# Patient Record
Sex: Female | Born: 1965
Health system: Southern US, Community
[De-identification: ages and names within clinical notes are randomized; demographics above are authoritative.]

## PROBLEM LIST (undated history)

## (undated) DIAGNOSIS — M1990 Unspecified osteoarthritis, unspecified site: Secondary | ICD-10-CM

## (undated) DIAGNOSIS — E559 Vitamin D deficiency, unspecified: Secondary | ICD-10-CM

## (undated) DIAGNOSIS — D649 Anemia, unspecified: Secondary | ICD-10-CM

## (undated) DIAGNOSIS — E119 Type 2 diabetes mellitus without complications: Secondary | ICD-10-CM

## (undated) DIAGNOSIS — I10 Essential (primary) hypertension: Secondary | ICD-10-CM

## (undated) DIAGNOSIS — F32A Depression, unspecified: Secondary | ICD-10-CM

## (undated) DIAGNOSIS — E785 Hyperlipidemia, unspecified: Secondary | ICD-10-CM

## (undated) HISTORY — DX: Hyperlipidemia, unspecified: E78.5

## (undated) HISTORY — DX: Depression, unspecified: F32.A

## (undated) HISTORY — DX: Type 2 diabetes mellitus without complications: E11.9

## (undated) HISTORY — DX: Vitamin D deficiency, unspecified: E55.9

---

## 1990-06-18 HISTORY — PX: DILATION AND CURETTAGE OF UTERUS: SHX78

## 1999-07-18 ENCOUNTER — Other Ambulatory Visit: Admission: RE | Admit: 1999-07-18 | Discharge: 1999-07-18 | Payer: Self-pay | Admitting: Obstetrics and Gynecology

## 2000-05-20 ENCOUNTER — Encounter: Payer: Self-pay | Admitting: Obstetrics and Gynecology

## 2000-05-20 ENCOUNTER — Ambulatory Visit (HOSPITAL_COMMUNITY): Admission: RE | Admit: 2000-05-20 | Discharge: 2000-05-20 | Payer: Self-pay | Admitting: Obstetrics and Gynecology

## 2000-07-12 ENCOUNTER — Other Ambulatory Visit: Admission: RE | Admit: 2000-07-12 | Discharge: 2000-07-12 | Payer: Self-pay | Admitting: Obstetrics and Gynecology

## 2001-07-29 ENCOUNTER — Other Ambulatory Visit: Admission: RE | Admit: 2001-07-29 | Discharge: 2001-07-29 | Payer: Self-pay

## 2001-08-11 ENCOUNTER — Encounter: Admission: RE | Admit: 2001-08-11 | Discharge: 2001-08-11 | Payer: Self-pay

## 2001-10-13 ENCOUNTER — Ambulatory Visit (HOSPITAL_COMMUNITY): Admission: RE | Admit: 2001-10-13 | Discharge: 2001-10-13 | Payer: Self-pay

## 2002-04-20 ENCOUNTER — Encounter: Admission: RE | Admit: 2002-04-20 | Discharge: 2002-04-20 | Payer: Self-pay

## 2002-07-03 ENCOUNTER — Ambulatory Visit (HOSPITAL_COMMUNITY): Admission: RE | Admit: 2002-07-03 | Discharge: 2002-07-03 | Payer: Self-pay

## 2002-07-03 ENCOUNTER — Encounter (INDEPENDENT_AMBULATORY_CARE_PROVIDER_SITE_OTHER): Payer: Self-pay | Admitting: *Deleted

## 2003-03-30 ENCOUNTER — Other Ambulatory Visit: Admission: RE | Admit: 2003-03-30 | Discharge: 2003-03-30 | Payer: Self-pay | Admitting: Obstetrics and Gynecology

## 2005-01-05 ENCOUNTER — Other Ambulatory Visit: Admission: RE | Admit: 2005-01-05 | Discharge: 2005-01-05 | Payer: Self-pay | Admitting: Obstetrics and Gynecology

## 2005-01-31 ENCOUNTER — Encounter: Admission: RE | Admit: 2005-01-31 | Discharge: 2005-01-31 | Payer: Self-pay | Admitting: Obstetrics and Gynecology

## 2005-12-26 ENCOUNTER — Other Ambulatory Visit: Admission: RE | Admit: 2005-12-26 | Discharge: 2005-12-26 | Payer: Self-pay | Admitting: Nurse Practitioner

## 2006-02-01 ENCOUNTER — Encounter: Admission: RE | Admit: 2006-02-01 | Discharge: 2006-02-01 | Payer: Self-pay | Admitting: Obstetrics and Gynecology

## 2007-02-03 ENCOUNTER — Encounter: Admission: RE | Admit: 2007-02-03 | Discharge: 2007-02-03 | Payer: Self-pay | Admitting: Obstetrics and Gynecology

## 2007-07-10 ENCOUNTER — Other Ambulatory Visit: Admission: RE | Admit: 2007-07-10 | Discharge: 2007-07-10 | Payer: Self-pay | Admitting: Obstetrics and Gynecology

## 2007-07-18 ENCOUNTER — Ambulatory Visit: Payer: Self-pay | Admitting: Oncology

## 2007-07-31 LAB — CBC WITH DIFFERENTIAL/PLATELET
BASO%: 0.1 % (ref 0.0–2.0)
Basophils Absolute: 0 10*3/uL (ref 0.0–0.1)
EOS%: 1.2 % (ref 0.0–7.0)
HCT: 33.5 % — ABNORMAL LOW (ref 34.8–46.6)
HGB: 11 g/dL — ABNORMAL LOW (ref 11.6–15.9)
MCH: 27.1 pg (ref 26.0–34.0)
MCHC: 32.9 g/dL (ref 32.0–36.0)
MONO#: 0.3 10*3/uL (ref 0.1–0.9)
NEUT%: 66.2 % (ref 39.6–76.8)
RDW: 16.7 % — ABNORMAL HIGH (ref 11.3–14.5)
WBC: 7.6 10*3/uL (ref 3.9–10.0)
lymph#: 2.2 10*3/uL (ref 0.9–3.3)

## 2007-07-31 LAB — COMPREHENSIVE METABOLIC PANEL
ALT: 11 U/L (ref 0–35)
Alkaline Phosphatase: 92 U/L (ref 39–117)
CO2: 23 mEq/L (ref 19–32)
Creatinine, Ser: 1 mg/dL (ref 0.40–1.20)
Sodium: 139 mEq/L (ref 135–145)
Total Bilirubin: 0.5 mg/dL (ref 0.3–1.2)

## 2007-07-31 LAB — IRON AND TIBC
%SAT: 13 % — ABNORMAL LOW (ref 20–55)
Iron: 39 ug/dL — ABNORMAL LOW (ref 42–145)
UIBC: 262 ug/dL

## 2007-07-31 LAB — CHCC SMEAR

## 2007-07-31 LAB — FERRITIN: Ferritin: 28 ng/mL (ref 10–291)

## 2007-11-05 ENCOUNTER — Ambulatory Visit: Payer: Self-pay | Admitting: Oncology

## 2008-02-04 ENCOUNTER — Encounter: Admission: RE | Admit: 2008-02-04 | Discharge: 2008-02-04 | Payer: Self-pay | Admitting: Obstetrics and Gynecology

## 2008-02-09 ENCOUNTER — Ambulatory Visit: Payer: Self-pay | Admitting: Oncology

## 2008-02-10 LAB — CBC WITH DIFFERENTIAL/PLATELET
Basophils Absolute: 0 10*3/uL (ref 0.0–0.1)
Eosinophils Absolute: 0.2 10*3/uL (ref 0.0–0.5)
HCT: 33 % — ABNORMAL LOW (ref 34.8–46.6)
HGB: 11 g/dL — ABNORMAL LOW (ref 11.6–15.9)
LYMPH%: 25.2 % (ref 14.0–48.0)
MCV: 83.7 fL (ref 81.0–101.0)
MONO#: 0.5 10*3/uL (ref 0.1–0.9)
MONO%: 5.4 % (ref 0.0–13.0)
NEUT#: 5.9 10*3/uL (ref 1.5–6.5)
NEUT%: 66.8 % (ref 39.6–76.8)
Platelets: 351 10*3/uL (ref 145–400)
WBC: 8.8 10*3/uL (ref 3.9–10.0)

## 2008-02-10 LAB — FERRITIN: Ferritin: 34 ng/mL (ref 10–291)

## 2008-02-10 LAB — IRON AND TIBC: %SAT: 7 % — ABNORMAL LOW (ref 20–55)

## 2008-05-07 ENCOUNTER — Ambulatory Visit: Payer: Self-pay | Admitting: Oncology

## 2008-05-11 LAB — COMPREHENSIVE METABOLIC PANEL
Albumin: 4 g/dL (ref 3.5–5.2)
BUN: 10 mg/dL (ref 6–23)
CO2: 25 mEq/L (ref 19–32)
Calcium: 9.5 mg/dL (ref 8.4–10.5)
Chloride: 102 mEq/L (ref 96–112)
Creatinine, Ser: 1.03 mg/dL (ref 0.40–1.20)

## 2008-05-11 LAB — CBC WITH DIFFERENTIAL/PLATELET
Basophils Absolute: 0 10*3/uL (ref 0.0–0.1)
Eosinophils Absolute: 0.1 10*3/uL (ref 0.0–0.5)
HCT: 35.1 % (ref 34.8–46.6)
HGB: 11.7 g/dL (ref 11.6–15.9)
MONO#: 0.4 10*3/uL (ref 0.1–0.9)
NEUT#: 7.1 10*3/uL — ABNORMAL HIGH (ref 1.5–6.5)
NEUT%: 70.6 % (ref 39.6–76.8)
WBC: 10 10*3/uL (ref 3.9–10.0)
lymph#: 2.4 10*3/uL (ref 0.9–3.3)

## 2008-05-11 LAB — FERRITIN: Ferritin: 50 ng/mL (ref 10–291)

## 2008-05-11 LAB — IRON AND TIBC: UIBC: 236 ug/dL

## 2008-08-11 ENCOUNTER — Other Ambulatory Visit: Admission: RE | Admit: 2008-08-11 | Discharge: 2008-08-11 | Payer: Self-pay | Admitting: Obstetrics and Gynecology

## 2008-11-08 ENCOUNTER — Ambulatory Visit: Payer: Self-pay | Admitting: Oncology

## 2008-11-10 LAB — CBC WITH DIFFERENTIAL/PLATELET
Basophils Absolute: 0 10*3/uL (ref 0.0–0.1)
Eosinophils Absolute: 0.2 10*3/uL (ref 0.0–0.5)
LYMPH%: 25.7 % (ref 14.0–49.7)
MCV: 84 fL (ref 79.5–101.0)
MONO%: 4.4 % (ref 0.0–14.0)
NEUT#: 6.1 10*3/uL (ref 1.5–6.5)
Platelets: 334 10*3/uL (ref 145–400)
RBC: 4.12 10*6/uL (ref 3.70–5.45)

## 2008-11-10 LAB — IRON AND TIBC: Iron: 44 ug/dL (ref 42–145)

## 2008-11-10 LAB — FERRITIN: Ferritin: 113 ng/mL (ref 10–291)

## 2009-02-07 ENCOUNTER — Encounter: Admission: RE | Admit: 2009-02-07 | Discharge: 2009-02-07 | Payer: Self-pay | Admitting: Obstetrics and Gynecology

## 2009-02-18 ENCOUNTER — Encounter: Admission: RE | Admit: 2009-02-18 | Discharge: 2009-02-18 | Payer: Self-pay | Admitting: Sports Medicine

## 2009-03-04 ENCOUNTER — Encounter: Admission: RE | Admit: 2009-03-04 | Discharge: 2009-03-04 | Payer: Self-pay | Admitting: Sports Medicine

## 2009-04-08 ENCOUNTER — Ambulatory Visit: Payer: Self-pay | Admitting: Oncology

## 2009-04-12 LAB — IRON AND TIBC
TIBC: 288 ug/dL (ref 250–470)
UIBC: 251 ug/dL

## 2009-04-12 LAB — CBC WITH DIFFERENTIAL/PLATELET
Basophils Absolute: 0 10*3/uL (ref 0.0–0.1)
Eosinophils Absolute: 0.1 10*3/uL (ref 0.0–0.5)
HGB: 10.9 g/dL — ABNORMAL LOW (ref 11.6–15.9)
MONO#: 0.6 10*3/uL (ref 0.1–0.9)
NEUT#: 5.2 10*3/uL (ref 1.5–6.5)
RDW: 15.2 % — ABNORMAL HIGH (ref 11.2–14.5)
lymph#: 2.7 10*3/uL (ref 0.9–3.3)

## 2009-04-12 LAB — COMPREHENSIVE METABOLIC PANEL
AST: 14 U/L (ref 0–37)
Albumin: 3.6 g/dL (ref 3.5–5.2)
BUN: 13 mg/dL (ref 6–23)
CO2: 23 mEq/L (ref 19–32)
Calcium: 8.9 mg/dL (ref 8.4–10.5)
Chloride: 105 mEq/L (ref 96–112)
Glucose, Bld: 101 mg/dL — ABNORMAL HIGH (ref 70–99)
Potassium: 4.1 mEq/L (ref 3.5–5.3)

## 2009-08-09 ENCOUNTER — Ambulatory Visit: Payer: Self-pay | Admitting: Oncology

## 2009-08-11 LAB — CBC WITH DIFFERENTIAL/PLATELET
Basophils Absolute: 0 10*3/uL (ref 0.0–0.1)
Eosinophils Absolute: 0.2 10*3/uL (ref 0.0–0.5)
HGB: 11.3 g/dL — ABNORMAL LOW (ref 11.6–15.9)
MONO#: 0.3 10*3/uL (ref 0.1–0.9)
MONO%: 3.9 % (ref 0.0–14.0)
NEUT#: 5.4 10*3/uL (ref 1.5–6.5)
RBC: 4.08 10*6/uL (ref 3.70–5.45)
RDW: 14.5 % (ref 11.2–14.5)
WBC: 8.5 10*3/uL (ref 3.9–10.3)
lymph#: 2.6 10*3/uL (ref 0.9–3.3)

## 2009-08-11 LAB — IRON AND TIBC
%SAT: 14 % — ABNORMAL LOW (ref 20–55)
Iron: 36 ug/dL — ABNORMAL LOW (ref 42–145)
TIBC: 258 ug/dL (ref 250–470)
UIBC: 222 ug/dL

## 2009-08-11 LAB — FERRITIN: Ferritin: 72 ng/mL (ref 10–291)

## 2009-12-05 ENCOUNTER — Ambulatory Visit: Payer: Self-pay | Admitting: Oncology

## 2009-12-07 LAB — CBC WITH DIFFERENTIAL/PLATELET
Basophils Absolute: 0 10*3/uL (ref 0.0–0.1)
HCT: 31.2 % — ABNORMAL LOW (ref 34.8–46.6)
HGB: 10.5 g/dL — ABNORMAL LOW (ref 11.6–15.9)
LYMPH%: 24 % (ref 14.0–49.7)
MCHC: 33.8 g/dL (ref 31.5–36.0)
MONO#: 0.5 10*3/uL (ref 0.1–0.9)
NEUT%: 69.5 % (ref 38.4–76.8)
Platelets: 329 10*3/uL (ref 145–400)
WBC: 9.9 10*3/uL (ref 3.9–10.3)
lymph#: 2.4 10*3/uL (ref 0.9–3.3)

## 2009-12-07 LAB — IRON AND TIBC
%SAT: 13 % — ABNORMAL LOW (ref 20–55)
TIBC: 298 ug/dL (ref 250–470)
UIBC: 260 ug/dL

## 2010-02-21 ENCOUNTER — Encounter: Admission: RE | Admit: 2010-02-21 | Discharge: 2010-02-21 | Payer: Self-pay | Admitting: Obstetrics and Gynecology

## 2010-02-23 ENCOUNTER — Other Ambulatory Visit: Admission: RE | Admit: 2010-02-23 | Discharge: 2010-02-23 | Payer: Self-pay | Admitting: Obstetrics and Gynecology

## 2010-03-07 ENCOUNTER — Encounter: Admission: RE | Admit: 2010-03-07 | Discharge: 2010-03-07 | Payer: Self-pay | Admitting: Obstetrics and Gynecology

## 2010-04-05 ENCOUNTER — Encounter: Admission: RE | Admit: 2010-04-05 | Discharge: 2010-04-05 | Payer: Self-pay | Admitting: Sports Medicine

## 2010-07-08 ENCOUNTER — Encounter: Payer: Self-pay | Admitting: Sports Medicine

## 2010-09-07 ENCOUNTER — Encounter (HOSPITAL_BASED_OUTPATIENT_CLINIC_OR_DEPARTMENT_OTHER): Payer: 59 | Admitting: Oncology

## 2010-09-07 ENCOUNTER — Other Ambulatory Visit: Payer: Self-pay | Admitting: Oncology

## 2010-09-07 DIAGNOSIS — D509 Iron deficiency anemia, unspecified: Secondary | ICD-10-CM

## 2010-09-07 LAB — CBC WITH DIFFERENTIAL/PLATELET
BASO%: 0.4 % (ref 0.0–2.0)
EOS%: 2 % (ref 0.0–7.0)
HCT: 33.4 % — ABNORMAL LOW (ref 34.8–46.6)
LYMPH%: 32.7 % (ref 14.0–49.7)
MCH: 27.7 pg (ref 25.1–34.0)
MCHC: 33.4 g/dL (ref 31.5–36.0)
NEUT%: 60.4 % (ref 38.4–76.8)
RBC: 4.03 10*6/uL (ref 3.70–5.45)
lymph#: 2.2 10*3/uL (ref 0.9–3.3)

## 2010-09-07 LAB — COMPREHENSIVE METABOLIC PANEL
ALT: 11 U/L (ref 0–35)
AST: 18 U/L (ref 0–37)
Chloride: 102 mEq/L (ref 96–112)
Creatinine, Ser: 1 mg/dL (ref 0.40–1.20)
Sodium: 136 mEq/L (ref 135–145)
Total Bilirubin: 0.3 mg/dL (ref 0.3–1.2)
Total Protein: 6.9 g/dL (ref 6.0–8.3)

## 2010-09-07 LAB — IRON AND TIBC: %SAT: 8 % — ABNORMAL LOW (ref 20–55)

## 2010-09-07 LAB — FERRITIN: Ferritin: 79 ng/mL (ref 10–291)

## 2010-11-03 NOTE — Op Note (Signed)
   NAME:  Denise Rogers, Denise Rogers                    ACCOUNT NO.:  [id number]   MEDICAL RECORD NO.:  [id number]                   PATIENT TYPE:  AMB   LOCATION:  SDC                                  FACILITY:  [facility]   PHYSICIAN:  Ronda Fairly. Galen Daft, M.D.              DATE OF BIRTH:  1966/05/14   DATE OF PROCEDURE:  07/03/2002  DATE OF DISCHARGE:  07/03/2002                                 OPERATIVE REPORT   PREOPERATIVE DIAGNOSES:  1. Fibroids.  2. Abnormal uterine bleeding.   POSTOPERATIVE DIAGNOSES:  1. Fibroids.  2. Abnormal uterine bleeding.   PROCEDURE:  Dilatation and curettage with diagnostic hysteroscopy.   SURGEON:  Ronda Fairly. Galen Daft, M.D.   ESTIMATED BLOOD LOSS:  Minimal.   COMPLICATIONS:  None.   ANESTHESIA:  LMA.   PROCEDURE NOTE:  The patient was identified as Development worker, international aid.  Time out  had been performed.  The patient had Betadine prep and sterile technique.  After anesthesia was complete the bladder was catheterized and the cervix  was dilated to accept the hysteroscope.  Hysteroscope was placed in as a  large posterior uterine fibroid.  Otherwise, unremarkable examination.  The  patient had tolerated procedure quite well; however, this fibroid was not  resectable with hysteroscopic instrumentation.  It would require  hysterectomy or open laparotomy if treatment was indicated.  The patient  left the operating room in stable condition after this instrument was  removed and curettage was performed.  There were no complications from the  procedure.  Total estimated blood loss was less than 10 mL and all  instrument, sponge, and needle counts were correct at the end of the case.                                               Ronda Fairly. Galen Daft, M.D.    NJT/MEDQ  D:  07/14/2002  T:  07/14/2002  Job:  [id number]

## 2011-03-01 ENCOUNTER — Other Ambulatory Visit: Payer: Self-pay | Admitting: Family Medicine

## 2011-03-01 DIAGNOSIS — Z1231 Encounter for screening mammogram for malignant neoplasm of breast: Secondary | ICD-10-CM

## 2011-03-27 ENCOUNTER — Ambulatory Visit
Admission: RE | Admit: 2011-03-27 | Discharge: 2011-03-27 | Disposition: A | Discharge status: Home / Self Care | Payer: 59 | Source: Ambulatory Visit | Attending: Family Medicine | Admitting: Family Medicine

## 2011-03-27 DIAGNOSIS — Z1231 Encounter for screening mammogram for malignant neoplasm of breast: Secondary | ICD-10-CM

## 2011-04-27 ENCOUNTER — Encounter (HOSPITAL_COMMUNITY): Payer: Self-pay | Admitting: Pharmacy Technician

## 2011-04-27 NOTE — Patient Instructions (Addendum)
   Your procedure is scheduled on: Monday, Nov 19th  Enter through the Main Entrance of [facility] at: 11:30am Pick up the phone at the desk and dial [phone] and inform us of your arrival.  Please call this number if you have any problems the morning of surgery: [phone]  Remember: Do not eat food after midnight: Sunday Do not drink clear liquids after: Sunday per anesthesia Take these medicines the morning of surgery with a SIP OF WATER:your blood pressure medicine  Do not wear jewelry, make-up, or FINGER nail polish Do not wear lotions, powders, or perfumes.  You may not  wear deodorant. Do not shave 48 hours prior to surgery. Do not bring valuables to the hospital.  Leave suitcase in the car. After Surgery it may be brought to your room. For patients being admitted to the hospital, checkout time is 11:00am the day of discharge.  Patients discharged on the day of surgery will not be allowed to drive home.     Remember to use your hibiclens as instructed.Please shower with 1/2 bottle the evening before your surgery and the other 1/2 bottle the morning of surgery.

## 2011-04-30 ENCOUNTER — Other Ambulatory Visit: Payer: Self-pay | Admitting: Obstetrics & Gynecology

## 2011-05-01 ENCOUNTER — Encounter (HOSPITAL_COMMUNITY)
Admission: RE | Admit: 2011-05-01 | Discharge: 2011-05-01 | Disposition: A | Discharge status: Home / Self Care | Payer: 59 | Source: Ambulatory Visit | Attending: Obstetrics & Gynecology | Admitting: Obstetrics & Gynecology

## 2011-05-01 ENCOUNTER — Encounter (HOSPITAL_COMMUNITY): Payer: Self-pay

## 2011-05-01 ENCOUNTER — Other Ambulatory Visit: Payer: Self-pay

## 2011-05-01 HISTORY — DX: Essential (primary) hypertension: I10

## 2011-05-01 HISTORY — DX: Anemia, unspecified: D64.9

## 2011-05-01 HISTORY — DX: Unspecified osteoarthritis, unspecified site: M19.90

## 2011-05-01 LAB — CBC
HCT: 34.7 % — ABNORMAL LOW (ref 36.0–46.0)
Hemoglobin: 11.2 g/dL — ABNORMAL LOW (ref 12.0–15.0)
MCH: 27.1 pg (ref 26.0–34.0)
RBC: 4.13 MIL/uL (ref 3.87–5.11)

## 2011-05-01 LAB — BASIC METABOLIC PANEL
BUN: 10 mg/dL (ref 6–23)
CO2: 29 mEq/L (ref 19–32)
Calcium: 9.8 mg/dL (ref 8.4–10.5)
Glucose, Bld: 108 mg/dL — ABNORMAL HIGH (ref 70–99)
Potassium: 3.8 mEq/L (ref 3.5–5.1)
Sodium: 137 mEq/L (ref 135–145)

## 2011-05-01 LAB — SURGICAL PCR SCREEN: Staphylococcus aureus: NEGATIVE

## 2011-05-07 ENCOUNTER — Ambulatory Visit (HOSPITAL_COMMUNITY): Payer: 59 | Admitting: Anesthesiology

## 2011-05-07 ENCOUNTER — Encounter (HOSPITAL_COMMUNITY): Payer: Self-pay | Admitting: Anesthesiology

## 2011-05-07 ENCOUNTER — Other Ambulatory Visit: Payer: Self-pay | Admitting: Obstetrics & Gynecology

## 2011-05-07 ENCOUNTER — Encounter (HOSPITAL_COMMUNITY): Payer: Self-pay | Admitting: *Deleted

## 2011-05-07 ENCOUNTER — Encounter (HOSPITAL_COMMUNITY)
Admission: RE | Disposition: A | Discharge status: Home / Self Care | Payer: Self-pay | Source: Ambulatory Visit | Attending: Obstetrics & Gynecology

## 2011-05-07 ENCOUNTER — Ambulatory Visit (HOSPITAL_COMMUNITY)
Admission: RE | Admit: 2011-05-07 | Discharge: 2011-05-08 | Disposition: A | Discharge status: Home / Self Care | Payer: 59 | Source: Ambulatory Visit | Attending: Obstetrics & Gynecology | Admitting: Obstetrics & Gynecology

## 2011-05-07 DIAGNOSIS — Z01812 Encounter for preprocedural laboratory examination: Secondary | ICD-10-CM | POA: Insufficient documentation

## 2011-05-07 DIAGNOSIS — N949 Unspecified condition associated with female genital organs and menstrual cycle: Secondary | ICD-10-CM | POA: Insufficient documentation

## 2011-05-07 DIAGNOSIS — N920 Excessive and frequent menstruation with regular cycle: Secondary | ICD-10-CM | POA: Insufficient documentation

## 2011-05-07 DIAGNOSIS — D259 Leiomyoma of uterus, unspecified: Secondary | ICD-10-CM | POA: Insufficient documentation

## 2011-05-07 DIAGNOSIS — Z01818 Encounter for other preprocedural examination: Secondary | ICD-10-CM | POA: Insufficient documentation

## 2011-05-07 HISTORY — PX: ABDOMINAL HYSTERECTOMY: SHX81

## 2011-05-07 HISTORY — PX: ROBOTIC ASSISTED TOTAL HYSTERECTOMY: SHX6085

## 2011-05-07 LAB — PREGNANCY, URINE: Preg Test, Ur: NEGATIVE

## 2011-05-07 LAB — ABO/RH: ABO/RH(D): A POS

## 2011-05-07 LAB — TYPE AND SCREEN: Antibody Screen: NEGATIVE

## 2011-05-07 SURGERY — ROBOTIC ASSISTED TOTAL HYSTERECTOMY
Anesthesia: General | Wound class: Clean Contaminated

## 2011-05-07 MED ORDER — HYDROMORPHONE HCL PF 1 MG/ML IJ SOLN: 0.2000 mg | INTRAMUSCULAR | Status: DC | PRN

## 2011-05-07 MED ORDER — KETOROLAC TROMETHAMINE 30 MG/ML IJ SOLN
15.0000 mg | Freq: Once | INTRAMUSCULAR | Status: AC | PRN
  Administered 2011-05-07: 30 mg via INTRAVENOUS

## 2011-05-07 MED ORDER — CEFAZOLIN SODIUM 1-5 GM-% IV SOLN
INTRAVENOUS | Status: AC
  Filled 2011-05-07: qty 50

## 2011-05-07 MED ORDER — PROPOFOL 10 MG/ML IV EMUL
INTRAVENOUS | Status: AC
Start: 2011-05-07 — End: 2011-05-07
  Filled 2011-05-07: qty 40

## 2011-05-07 MED ORDER — IBUPROFEN 800 MG PO TABS: 800.0000 mg | ORAL_TABLET | Freq: Three times a day (TID) | ORAL | Status: DC | PRN

## 2011-05-07 MED ORDER — ROCURONIUM BROMIDE 50 MG/5ML IV SOLN
INTRAVENOUS | Status: AC
  Filled 2011-05-07: qty 1

## 2011-05-07 MED ORDER — NEOSTIGMINE METHYLSULFATE 1 MG/ML IJ SOLN
INTRAMUSCULAR | Status: AC
  Filled 2011-05-07: qty 10

## 2011-05-07 MED ORDER — LIDOCAINE HCL (CARDIAC) 20 MG/ML IV SOLN
INTRAVENOUS | Status: AC
  Filled 2011-05-07: qty 5

## 2011-05-07 MED ORDER — FENTANYL CITRATE 0.05 MG/ML IJ SOLN
INTRAMUSCULAR | Status: AC
  Filled 2011-05-07: qty 2

## 2011-05-07 MED ORDER — ONDANSETRON HCL 4 MG/2ML IJ SOLN
INTRAMUSCULAR | Status: DC | PRN
  Administered 2011-05-07: 4 mg via INTRAVENOUS

## 2011-05-07 MED ORDER — PROPOFOL 10 MG/ML IV EMUL
INTRAVENOUS | Status: DC | PRN
  Administered 2011-05-07: 150 mg via INTRAVENOUS
  Administered 2011-05-07: 20 mg via INTRAVENOUS
  Administered 2011-05-07: 50 mg via INTRAVENOUS
  Administered 2011-05-07 (×2): 20 mg via INTRAVENOUS

## 2011-05-07 MED ORDER — MIDAZOLAM HCL 2 MG/2ML IJ SOLN
INTRAMUSCULAR | Status: AC
Start: 2011-05-07 — End: 2011-05-07
  Filled 2011-05-07: qty 2

## 2011-05-07 MED ORDER — LIDOCAINE HCL (CARDIAC) 20 MG/ML IV SOLN
INTRAVENOUS | Status: DC | PRN
  Administered 2011-05-07: 60 mg via INTRAVENOUS

## 2011-05-07 MED ORDER — LISINOPRIL-HYDROCHLOROTHIAZIDE 10-12.5 MG PO TABS: 1.0000 | ORAL_TABLET | Freq: Every day | ORAL | Status: DC

## 2011-05-07 MED ORDER — FENTANYL CITRATE 0.05 MG/ML IJ SOLN
25.0000 ug | INTRAMUSCULAR | Status: DC | PRN
  Administered 2011-05-07 (×2): 25 ug via INTRAVENOUS
  Administered 2011-05-07: 50 ug via INTRAVENOUS

## 2011-05-07 MED ORDER — CEFAZOLIN SODIUM 1-5 GM-% IV SOLN
1.0000 g | INTRAVENOUS | Status: AC
  Administered 2011-05-07: 1 g via INTRAVENOUS

## 2011-05-07 MED ORDER — ROCURONIUM BROMIDE 50 MG/5ML IV SOLN
INTRAVENOUS | Status: AC
Start: 2011-05-07 — End: 2011-05-07
  Filled 2011-05-07: qty 1

## 2011-05-07 MED ORDER — FENTANYL CITRATE 0.05 MG/ML IJ SOLN
INTRAMUSCULAR | Status: DC | PRN
  Administered 2011-05-07: 50 ug via INTRAVENOUS
  Administered 2011-05-07 (×3): 25 ug via INTRAVENOUS

## 2011-05-07 MED ORDER — ONDANSETRON HCL 4 MG/2ML IJ SOLN
INTRAMUSCULAR | Status: AC
  Filled 2011-05-07: qty 2

## 2011-05-07 MED ORDER — ROCURONIUM BROMIDE 100 MG/10ML IV SOLN
INTRAVENOUS | Status: DC | PRN
  Administered 2011-05-07 (×2): 10 mg via INTRAVENOUS
  Administered 2011-05-07: 5 mg via INTRAVENOUS
  Administered 2011-05-07 (×2): 10 mg via INTRAVENOUS
  Administered 2011-05-07: 50 mg via INTRAVENOUS
  Administered 2011-05-07: 5 mg via INTRAVENOUS

## 2011-05-07 MED ORDER — DEXAMETHASONE SODIUM PHOSPHATE 10 MG/ML IJ SOLN
INTRAMUSCULAR | Status: DC | PRN
  Administered 2011-05-07: 10 mg via INTRAVENOUS

## 2011-05-07 MED ORDER — LISINOPRIL 10 MG PO TABS
10.0000 mg | ORAL_TABLET | Freq: Every day | ORAL | Status: DC
  Administered 2011-05-08: 10 mg via ORAL
  Filled 2011-05-07 (×2): qty 1

## 2011-05-07 MED ORDER — KETOROLAC TROMETHAMINE 30 MG/ML IJ SOLN
INTRAMUSCULAR | Status: AC
  Filled 2011-05-07: qty 1

## 2011-05-07 MED ORDER — SUFENTANIL CITRATE 50 MCG/ML IV SOLN
INTRAVENOUS | Status: AC
  Filled 2011-05-07: qty 1

## 2011-05-07 MED ORDER — OXYCODONE-ACETAMINOPHEN 5-325 MG PO TABS
1.0000 | ORAL_TABLET | ORAL | Status: DC | PRN
  Administered 2011-05-08 (×2): 1 via ORAL
  Filled 2011-05-07 (×2): qty 1

## 2011-05-07 MED ORDER — LACTATED RINGERS IV SOLN
INTRAVENOUS | Status: DC
  Administered 2011-05-07: 23:00:00 via INTRAVENOUS

## 2011-05-07 MED ORDER — KETOROLAC TROMETHAMINE 30 MG/ML IJ SOLN
30.0000 mg | Freq: Four times a day (QID) | INTRAMUSCULAR | Status: DC
  Administered 2011-05-08 (×2): 30 mg via INTRAVENOUS
  Filled 2011-05-07 (×2): qty 1

## 2011-05-07 MED ORDER — LACTATED RINGERS IV SOLN
INTRAVENOUS | Status: DC
  Administered 2011-05-07 (×3): via INTRAVENOUS

## 2011-05-07 MED ORDER — HYDROCHLOROTHIAZIDE 12.5 MG PO CAPS
12.5000 mg | ORAL_CAPSULE | Freq: Every day | ORAL | Status: DC
  Administered 2011-05-08: 12.5 mg via ORAL
  Filled 2011-05-07 (×2): qty 1

## 2011-05-07 MED ORDER — BUPIVACAINE HCL (PF) 0.25 % IJ SOLN
INTRAMUSCULAR | Status: DC | PRN
  Administered 2011-05-07: 15 mL

## 2011-05-07 MED ORDER — PROPOFOL 10 MG/ML IV EMUL
INTRAVENOUS | Status: AC
  Filled 2011-05-07: qty 20

## 2011-05-07 MED ORDER — MIDAZOLAM HCL 5 MG/5ML IJ SOLN
INTRAMUSCULAR | Status: DC | PRN
  Administered 2011-05-07: 2 mg via INTRAVENOUS

## 2011-05-07 MED ORDER — GLYCOPYRROLATE 0.2 MG/ML IJ SOLN
INTRAMUSCULAR | Status: AC
  Filled 2011-05-07: qty 2

## 2011-05-07 MED ORDER — SUFENTANIL CITRATE 50 MCG/ML IV SOLN
INTRAVENOUS | Status: DC | PRN
  Administered 2011-05-07: 10 ug via INTRAVENOUS
  Administered 2011-05-07 (×2): 5 ug via INTRAVENOUS
  Administered 2011-05-07: 10 ug via INTRAVENOUS
  Administered 2011-05-07: 5 ug via INTRAVENOUS
  Administered 2011-05-07: 10 ug via INTRAVENOUS
  Administered 2011-05-07: 15 ug via INTRAVENOUS

## 2011-05-07 MED ORDER — DEXAMETHASONE SODIUM PHOSPHATE 10 MG/ML IJ SOLN
INTRAMUSCULAR | Status: AC
  Filled 2011-05-07: qty 1

## 2011-05-07 SURGICAL SUPPLY — 61 items
BAG URINE DRAINAGE (UROLOGICAL SUPPLIES) ×2 IMPLANT
BARRIER ADHS 3X4 INTERCEED (GAUZE/BANDAGES/DRESSINGS) ×2 IMPLANT
BLADE LAPAROSCOPIC MORCELL KIT (BLADE) ×4 IMPLANT
BLADELESS LONG 8MM (BLADE) IMPLANT
CABLE HIGH FREQUENCY MONO STRZ (ELECTRODE) ×2 IMPLANT
CATH FOLEY 3WAY  5CC 16FR (CATHETERS) ×1
CATH FOLEY 3WAY 5CC 16FR (CATHETERS) ×1 IMPLANT
CLOTH BEACON ORANGE TIMEOUT ST (SAFETY) ×2 IMPLANT
CONT PATH 16OZ SNAP LID 3702 (MISCELLANEOUS) ×2 IMPLANT
COVER MAYO STAND STRL (DRAPES) ×2 IMPLANT
COVER TABLE BACK 60X90 (DRAPES) ×4 IMPLANT
COVER TIP SHEARS 8 DVNC (MISCELLANEOUS) ×1 IMPLANT
COVER TIP SHEARS 8MM DA VINCI (MISCELLANEOUS) ×1
DECANTER SPIKE VIAL GLASS SM (MISCELLANEOUS) ×2 IMPLANT
DERMABOND ADVANCED (GAUZE/BANDAGES/DRESSINGS) ×2
DERMABOND ADVANCED .7 DNX12 (GAUZE/BANDAGES/DRESSINGS) ×2 IMPLANT
DRAPE HUG U DISPOSABLE (DRAPE) ×2 IMPLANT
DRAPE LG THREE QUARTER DISP (DRAPES) ×4 IMPLANT
DRAPE MONITOR DA VINCI (DRAPE) ×2 IMPLANT
DRAPE WARM FLUID 44X44 (DRAPE) ×2 IMPLANT
ELECT REM PT RETURN 9FT ADLT (ELECTROSURGICAL) ×2
ELECTRODE REM PT RTRN 9FT ADLT (ELECTROSURGICAL) ×1 IMPLANT
EVACUATOR SMOKE 8.L (FILTER) ×2 IMPLANT
GAUZE VASELINE 3X9 (GAUZE/BANDAGES/DRESSINGS) ×2 IMPLANT
GLOVE BIO SURGEON STRL SZ 6.5 (GLOVE) ×4 IMPLANT
GOWN PREVENTION PLUS LG XLONG (DISPOSABLE) ×8 IMPLANT
IV STOPCOCK 4 WAY 40  W/Y SET (IV SOLUTION)
IV STOPCOCK 4 WAY 40 W/Y SET (IV SOLUTION) IMPLANT
KIT DISP ACCESSORY 4 ARM (KITS) ×2 IMPLANT
NEEDLE HYPO 22GX1.5 SAFETY (NEEDLE) IMPLANT
NEEDLE INSUFFLATION 14GA 120MM (NEEDLE) IMPLANT
OCCLUDER COLPOPNEUMO (BALLOONS) ×2 IMPLANT
PACK LAVH (CUSTOM PROCEDURE TRAY) ×2 IMPLANT
PAD PREP 24X48 CUFFED NSTRL (MISCELLANEOUS) ×4 IMPLANT
PLUG CATH AND CAP STER (CATHETERS) ×2 IMPLANT
POSITIONER SURGICAL ARM (MISCELLANEOUS) IMPLANT
SET IRRIG TUBING LAPAROSCOPIC (IRRIGATION / IRRIGATOR) ×2 IMPLANT
SOLUTION ELECTROLUBE (MISCELLANEOUS) ×2 IMPLANT
SPONGE LAP 18X18 X RAY DECT (DISPOSABLE) IMPLANT
SUT VIC AB 0 CT1 27 (SUTURE) ×2
SUT VIC AB 0 CT1 27XBRD ANBCTR (SUTURE) ×2 IMPLANT
SUT VIC AB 0 CT1 27XBRD ANTBC (SUTURE) IMPLANT
SUT VIC AB 4-0 PS2 27 (SUTURE) IMPLANT
SUT VICRYL 0 27 CT2 27 ABS (SUTURE) IMPLANT
SUT VICRYL 0 UR6 27IN ABS (SUTURE) ×4 IMPLANT
SUT VICRYL RAPIDE 4/0 PS 2 (SUTURE) ×2 IMPLANT
SYR 50ML LL SCALE MARK (SYRINGE) ×2 IMPLANT
SYSTEM CONVERTIBLE TROCAR (TROCAR) ×2 IMPLANT
TIP UTERINE 5.1X6CM LAV DISP (MISCELLANEOUS) IMPLANT
TIP UTERINE 6.7X10CM GRN DISP (MISCELLANEOUS) ×2 IMPLANT
TIP UTERINE 6.7X6CM WHT DISP (MISCELLANEOUS) IMPLANT
TIP UTERINE 6.7X8CM BLUE DISP (MISCELLANEOUS) IMPLANT
TOWEL OR 17X24 6PK STRL BLUE (TOWEL DISPOSABLE) ×6 IMPLANT
TROCAR 12M 150ML BLUNT (TROCAR) IMPLANT
TROCAR DISP BLADELESS 8 DVNC (TROCAR) ×1 IMPLANT
TROCAR DISP BLADELESS 8MM (TROCAR) ×1
TROCAR XCEL 12X100 BLDLESS (ENDOMECHANICALS) ×2 IMPLANT
TROCAR XCEL NON-BLD 5MMX100MML (ENDOMECHANICALS) ×2 IMPLANT
TUBING FILTER THERMOFLATOR (ELECTROSURGICAL) ×2 IMPLANT
WARMER LAPAROSCOPE (MISCELLANEOUS) ×2 IMPLANT
WATER STERILE IRR 1000ML POUR (IV SOLUTION) ×6 IMPLANT

## 2011-05-07 NOTE — Op Note (Signed)
05/07/2011  7:40 PM  PATIENT:  Denise Rogers  45 y.o. female  PRE-OPERATIVE DIAGNOSIS:  Large symptomatic myomas  POST-OPERATIVE DIAGNOSIS:  Large symptomatic myomas, very calcified  PROCEDURE:  Procedure(s): ROBOTIC ASSISTED TOTAL HYSTERECTOMY and MORCELLATION   SURGEON:  Surgeon(s): Marie-Lyne Steva Ready A. Fogleman  ASSISTANTS: Alphonsus Sias. Fogleman  ANESTHESIA:   general  Procedure:  Under general anesthesia, the patient is in lithotomy position. She is prepped with surgi- prep on the abdomen and Betadine on the suprapubic and vulvar areas.  She is draped as usual. Vaginally the Foley catheter is put in place in the bladder. The vaginal exam reveals a very large nodular uterus which is about 16 cm in diameter, it is mobile.  The weighted speculum is inserted in the vagina the tenaculum is applied on the anterior lip of the cervix the hysterometry is at 10 cm. We dilated the cervix with Hegar dilators to number 21 and used a #10 roomy with a small Ko-ring.  The tenaculum and weighted speculum are removed.  At the abdomen we measured 10 cm above the fundus of the uterus, a 1.5 cm on supraumbilical incision was made with a scalpel after infiltration with Marcaine one quarter plain.  The aponeurosis was opened under direct vision with the Mayo scissors and the perirectal peritoneum was opened bluntly with a finger. A pursestring stitch of Vicryl was called put on the aponeurosis. We then inserted the [facility] and created a pneumoperitoneum with CO2. The camera was entered at that level. The uterus was large and presented multiple myomas.  No adhesion was present with the anterior wall of the abdomen. We used a semicircular configuration for port placement. Infiltration with Marcaine one quarter plain at all sites.  An incision was made with a scalpel for 8 mm robotic ports and a 5 mm assistant port.  2 robotic ports were placed on the right side and the assistant port was on the upper left  side with the other robotic port on the lower left.  All ports were put in place under direct vision.  We then docked the robot from the right side. The instruments were inserted under direct vision.  The Endo Shears scissors in the right hand, the PK in the left hand and the pro-grasp in the 3rd arm.  We then went to the console. The console time was about 2 hours and 15 minutes.  We started on the right side.  The right ovary and tube were normal in size and appearance.  We cauterized and sectioned the right tube, the right utero-ovarian ligament and the right round ligament.  We then opened the visceral peritoneum anteriorly and posteriorly and stopped before the uterine artery.  We proceeded exactly the same way on the left side. The third arm was used at all times to help with retraction.  We then switched to a 30 down camera to be able to visualize the anterior part of the uterus better in order to descend the bladder flap.  The visceral peritoneum was completely opened anteriorly and the bladder was descended passed the core ring. On either side of the cervix were large myomas, exactly at the level where we usually cauterize the uterine arteries.  We therefore cauterized the uterine arteries at a higher level in order to minimize blood loss.  We then lifted the fibroids with the tenaculum in the third arm and stayed very close to the the uterus to complete the cauterization of the uterine arteries on  both sides.  We then opened the vaginal vault on the Ko-ring starting anteriorly, then on the sides and finishing posteriorly.  The Ko-ring and roomy are were removed to complete the cauterization and section of the vaginal vault posteriorly.  We then verified hemostasis and it was adequate at all sites. We switched instruments to the cutting needle driver in the right hand, the regular needle driver on the left hand and the PK in the third arm.  We switched back to the straight camera.  We used a V-Lock suture  to close the vaginal vault.  We started at the right angle, taking large bites in a running suture, including the vaginal mucosa.  We doubled the closure by coming back on our steps after reaching the left angle.  The occluder was removed from the vagina.  Hemostasis was adequate at all levels. We therefore removed all instruments. We undocked the robot at 16:03.  We then went to laparoscopy time.  The V-lock suture was removed through the camera port using a 5 mm camera in the assistant port.  Repositioned the patient in less Trendelenburg and raised her legs. The Stortz morcellator was inserted at the supraumbilical incision.  Morcellation went uneventfully but took more time than expected due to the large calcified myoma. We had to use a second Stortz morcellator because the blade was becoming dull.  The weight of the uterus with the myomas was above 1300 g.  We then irrigated and suctioned the abdominopelvic cavities. Hemostasis was verified and was adequate at all levels. Pictures were taken before and after the hysterectomy and  The procedure was recorded on DVD.  All instruments were removed.  The ports were removed under direct vision.  The CO2 was evacuated.  The supraumbilical incision was of close by attaching the pursestring stitch. We then closed the skin on all incisions with a subcuticular stitch of Vicryl 4-0. Dermabond was applied on all incisions. The patient was brought to recovery room in good stable status.  ESTIMATED BLOOD LOSS: 100 cc   Intake/Output Summary (Last 24 hours) at 05/07/11 1940 Last data filed at 05/07/11 1700  Gross per 24 hour  Intake   2000 ml  Output    550 ml  Net   1450 ml     BLOOD ADMINISTERED:none   LOCAL MEDICATIONS USED:  MARCAINE 20 CC  SPECIMEN:  Source of Specimen:  Myomatous uterus with cervix  DISPOSITION OF SPECIMEN:  PATHOLOGY  COUNTS:  YES   PLAN OF CARE: Transfer to PACU    Genia Del MD  19:40 on 05/07/11

## 2011-05-07 NOTE — H&P (Signed)
Denise Rogers is an 45 y.o. female [id number]  RP:  Large symptomatic myomas for TLH da Vinci, morcellation  Pertinent Gynecological History: Menses: Heavy  Blood transfusions: none Sexually transmitted diseases: none Previous GYN Procedures:  none  Last mammogram: normal Last pap: normal  Menstrual History:  No LMP recorded.    Past Medical History  Diagnosis Date  . Hypertension   . Anemia   . Arthritis     shoulder    Past Surgical History  Procedure Date  . Dilation and curettage of uterus 1992    History reviewed. No pertinent family history.  Social History:  reports that she has never smoked. She does not have any smokeless tobacco history on file. She reports that she does not drink alcohol or use illicit drugs.  Allergies: No Known Allergies  Prescriptions prior to admission  Medication Sig Dispense Refill  . lisinopril-hydrochlorothiazide (PRINZIDE,ZESTORETIC) 10-12.5 MG per tablet Take 1 tablet by mouth daily.        Marland Kitchen aspirin EC 81 MG tablet Take 81 mg by mouth daily.        . B Complex-C-Min-Fe-FA (HEMOCYTE-PLUS PO) Take 1 tablet by mouth daily.        . Calcium Carbonate-Vitamin D (CALCIUM + D) 600-200 MG-UNIT TABS Take 1 tablet by mouth daily.        . Multiple Vitamins-Minerals (MULTIVITAMIN WITH MINERALS) tablet Take 1 tablet by mouth daily.        Marland Kitchen omega-3 acid ethyl esters (LOVAZA) 1 G capsule Take 1 g by mouth daily.        . Potassium 99 MG TABS Take 1 tablet by mouth daily.          Blood pressure 128/71, pulse 88, temperature 98.2 F (36.8 C), temperature source Oral, resp. rate 18, SpO2 100.00%. US uterus about 1600 cc  with multiple myomas.  Results for orders placed during the hospital encounter of 05/07/11 (from the past 24 hour(s))  PREGNANCY, URINE     Status: Normal   Collection Time   05/07/11 11:26 AM      Component Value Range   Preg Test, Ur NEGATIVE    TYPE AND SCREEN     Status: Normal   Collection Time   05/07/11  11:27 AM      Component Value Range   ABO/RH(D) A POS     Antibody Screen NEG     Sample Expiration 05/10/2011    ABO/RH     Status: Normal   Collection Time   05/07/11 11:50 AM      Component Value Range   ABO/RH(D) A POS      No results found.  Assessment/Plan: Large symptomatic myomas with pelvic pain and menorrhagia for [facility] da Vinci, morcellation.  Surgery and risks reviewed.  Evelena Masci,MARIE-LYNE 05/07/2011, 1:07 PM

## 2011-05-07 NOTE — Transfer of Care (Signed)
Immediate Anesthesia Transfer of Care Note  Patient: Denise Rogers  Procedure(s) Performed:  ROBOTIC ASSISTED TOTAL HYSTERECTOMY - DaVinci TLH with Storz morcellator. Requests 3 hrs.  Patient Location: PACU  Anesthesia Type: General  Level of Consciousness: awake and sedated  Airway & Oxygen Therapy: Patient Spontanous Breathing and Patient connected to nasal cannula oxygen  Post-op Assessment: Report given to PACU RN and Post -op Vital signs reviewed and stable  Post vital signs: Reviewed and stable  Complications: No apparent anesthesia complications

## 2011-05-07 NOTE — Anesthesia Preprocedure Evaluation (Signed)
Anesthesia Evaluation  Patient identified by MRN, date of birth, ID band Patient awake    Reviewed: Allergy & Precautions, H&P , NPO status , Patient's Chart, lab work & pertinent test results, reviewed documented beta blocker date and time   History of Anesthesia Complications Negative for: history of anesthetic complications  Airway Mallampati: II      Dental  (+) Teeth Intact   Pulmonary neg pulmonary ROS,  clear to auscultation  Pulmonary exam normal       Cardiovascular Exercise Tolerance: Good hypertension, On Medications regular Normal+ Systolic murmurs (ii/vi)    Neuro/Psych Back pain - improved after pain injections Negative Neurological ROS  Negative Psych ROS   GI/Hepatic negative GI ROS, Neg liver ROS,   Endo/Other  Morbid obesity  Renal/GU negative Renal ROS  Genitourinary negative   Musculoskeletal   Abdominal   Peds  Hematology negative hematology ROS (+) anemia ,   Anesthesia Other Findings   Reproductive/Obstetrics negative OB ROS                           Anesthesia Physical Anesthesia Plan  ASA: II  Anesthesia Plan: General   Post-op Pain Management:    Induction:   Airway Management Planned: Oral ETT  Additional Equipment:   Intra-op Plan:   Post-operative Plan:   Informed Consent: I have reviewed the patients History and Physical, chart, labs and discussed the procedure including the risks, benefits and alternatives for the proposed anesthesia with the patient or authorized representative who has indicated his/her understanding and acceptance.   Dental Advisory Given  Plan Discussed with: CRNA and Surgeon  Anesthesia Plan Comments:         Anesthesia Quick Evaluation

## 2011-05-07 NOTE — Anesthesia Postprocedure Evaluation (Signed)
Anesthesia Post Note  Patient: Denise Rogers  Procedure(s) Performed:  ROBOTIC ASSISTED TOTAL HYSTERECTOMY - DaVinci TLH with Storz morcellator. Requests 3 hrs.  Anesthesia type: General  Patient location: PACU  Post pain: Pain level controlled  Post assessment: Post-op Vital signs reviewed  Last Vitals:  Filed Vitals:   05/07/11 2115  BP: 121/64  Pulse: 90  Temp:   Resp: 18    Post vital signs: Reviewed  Level of consciousness: sedated  Complications: No apparent anesthesia complicationsfj

## 2011-05-08 LAB — CBC
MCH: 27.2 pg (ref 26.0–34.0)
MCHC: 33.1 g/dL (ref 30.0–36.0)
RDW: 14.8 % (ref 11.5–15.5)

## 2011-05-08 MED ORDER — OXYCODONE-ACETAMINOPHEN 7.5-325 MG PO TABS: 1.0000 | ORAL_TABLET | ORAL | Status: AC | PRN

## 2011-05-08 NOTE — Progress Notes (Signed)
Pt discharged to home with family.  Condition stable.  Pt to car via wheelchair.  No equipment ordered for home at discharged.

## 2011-05-08 NOTE — Progress Notes (Signed)
1 Day Post-Op Procedure(s) (LRB): ROBOTIC ASSISTED TOTAL HYSTERECTOMY (N/A)  Subjective: Patient reports that pain is well managed.  Tolerating normal diet as tolerated  diet without difficulty. No nausea / vomiting.  Ambulating and voiding.  Objective: BP 102/65  Pulse 92  Temp(Src) 99.3 F (37.4 C) (Oral)  Resp 20  Ht 5\' 6"  (1.676 m)  Wt 102.059 kg (225 lb)  BMI 36.32 kg/m2  SpO2 97% Lungs: clear Heart: normal rate and rhythm Abdomen:soft and appropriately tender Extremities: Homans sign is negative, no sign of DVT Incision: healing well  Assessment: s/p Procedure(s): ROBOTIC ASSISTED TOTAL HYSTERECTOMY: stable  Plan: Discharge home  Post op recommendations discussed.  LOS: 1 day    Denise Rogers,Denise Rogers 05/08/2011, 1:31 PM

## 2011-05-08 NOTE — Discharge Summary (Signed)
  Physician Discharge Summary  Patient ID: Denise Rogers MRN: [id number] DOB/AGE: [birth year] 45 y.o.  Admit date: 05/07/2011 Discharge date: 05/08/2011  Admission Diagnoses: Large symptomatic myomas  Discharge Diagnoses: Large symptomatic myomas        Active Problems:  * No active hospital problems. *    Discharged Condition: good  Hospital Course:   Consults: none  Treatments: surgery: Robotic TLH  Disposition: Final discharge disposition not confirmed   Current Discharge Medication List    CONTINUE these medications which have NOT CHANGED   Details  lisinopril-hydrochlorothiazide (PRINZIDE,ZESTORETIC) 10-12.5 MG per tablet Take 1 tablet by mouth daily.      aspirin EC 81 MG tablet Take 81 mg by mouth daily.      B Complex-C-Min-Fe-FA (HEMOCYTE-PLUS PO) Take 1 tablet by mouth daily.      Calcium Carbonate-Vitamin D (CALCIUM + D) 600-200 MG-UNIT TABS Take 1 tablet by mouth daily.      Multiple Vitamins-Minerals (MULTIVITAMIN WITH MINERALS) tablet Take 1 tablet by mouth daily.      omega-3 acid ethyl esters (LOVAZA) 1 G capsule Take 1 g by mouth daily.      Potassium 99 MG TABS Take 1 tablet by mouth daily.         Follow-up Information    Follow up with Filbert Craze,MARIE-LYNE in 3 weeks.   Contact information:   [address] [place] [address] [phone]          Signed: Genia Del 05/08/2011, 1:44 PM

## 2011-08-20 ENCOUNTER — Telehealth: Payer: Self-pay | Admitting: Oncology

## 2011-08-20 NOTE — Telephone Encounter (Signed)
called pt and scheduled appt for 04/02. d/t per pt

## 2011-09-11 ENCOUNTER — Other Ambulatory Visit: Payer: 59 | Admitting: Lab

## 2011-09-11 ENCOUNTER — Ambulatory Visit: Payer: 59 | Admitting: Oncology

## 2011-09-18 ENCOUNTER — Ambulatory Visit (HOSPITAL_BASED_OUTPATIENT_CLINIC_OR_DEPARTMENT_OTHER): Payer: 59 | Admitting: Oncology

## 2011-09-18 ENCOUNTER — Telehealth: Payer: Self-pay | Admitting: Oncology

## 2011-09-18 ENCOUNTER — Other Ambulatory Visit (HOSPITAL_BASED_OUTPATIENT_CLINIC_OR_DEPARTMENT_OTHER): Payer: 59 | Admitting: Lab

## 2011-09-18 VITALS — BP 114/79 | HR 64 | Temp 97.2°F | Ht 66.0 in | Wt 216.1 lb

## 2011-09-18 DIAGNOSIS — D649 Anemia, unspecified: Secondary | ICD-10-CM

## 2011-09-18 DIAGNOSIS — D509 Iron deficiency anemia, unspecified: Secondary | ICD-10-CM

## 2011-09-18 LAB — CBC WITH DIFFERENTIAL/PLATELET
BASO%: 0.6 % (ref 0.0–2.0)
Eosinophils Absolute: 0.1 10*3/uL (ref 0.0–0.5)
LYMPH%: 39 % (ref 14.0–49.7)
MCHC: 32.8 g/dL (ref 31.5–36.0)
MCV: 85.5 fL (ref 79.5–101.0)
MONO#: 0.3 10*3/uL (ref 0.1–0.9)
MONO%: 5 % (ref 0.0–14.0)
NEUT#: 3.3 10*3/uL (ref 1.5–6.5)
Platelets: 316 10*3/uL (ref 145–400)
RBC: 4.32 10*6/uL (ref 3.70–5.45)
RDW: 15.4 % — ABNORMAL HIGH (ref 11.2–14.5)
WBC: 6.1 10*3/uL (ref 3.9–10.3)
nRBC: 0 % (ref 0–0)

## 2011-09-18 LAB — IRON AND TIBC
%SAT: 19 % — ABNORMAL LOW (ref 20–55)
Iron: 52 ug/dL (ref 42–145)

## 2011-09-18 NOTE — Progress Notes (Signed)
Hematology and Oncology Follow Up Visit  Denise Rogers [id number] 12/12/1965 46 y.o. 09/18/2011 10:50 AM  CC: [name], MD  Principle Diagnosis: :  This is a 46 year old female with iron deficiency anemia due to heavy menstrual cycles her GI workup did not reveal any acute blood loss.  She does have uterine fibroids.   Current therapy: She is on iron sulfate 325 mg daily.  Interim History: Denise Rogers presents today for a follow-up visit.  She is a pleasant 46 year old female with iron deficiency anemia due to heavy menstrual cycles.  She has been on oral iron replacement and seems to have helped in maintaining her hemoglobin around 11, which she functions at a normal level at that point.  She under went a hysterectomy in 04/2011 and have done wells since. She has not reported any major changes in her performance status or activity level.  She has not reported any deterioration in her health.  She is still taking Fe at this time.    Medications: I have reviewed the patient's current medications. Current outpatient prescriptions:aspirin EC 81 MG tablet, Take 81 mg by mouth daily.  , Disp: , Rfl: ;  B Complex-C-Min-Fe-FA (HEMOCYTE-PLUS PO), Take 1 tablet by mouth daily.  , Disp: , Rfl: ;  Calcium Carbonate-Vitamin D (CALCIUM + D) 600-200 MG-UNIT TABS, Take 1 tablet by mouth daily.  , Disp: , Rfl: ;  lisinopril-hydrochlorothiazide (PRINZIDE,ZESTORETIC) 10-12.5 MG per tablet, Take 1 tablet by mouth daily.  , Disp: , Rfl:  Multiple Vitamins-Minerals (MULTIVITAMIN WITH MINERALS) tablet, Take 1 tablet by mouth daily.  , Disp: , Rfl: ;  omega-3 acid ethyl esters (LOVAZA) 1 G capsule, Take 1 g by mouth daily.  , Disp: , Rfl: ;  Potassium 99 MG TABS, Take 1 tablet by mouth daily.  , Disp: , Rfl:   Allergies: No Known Allergies  Past Medical History, Surgical history, Social history, and Family History were reviewed and updated.  Review of Systems: Constitutional:  Negative for fever,  chills, night sweats, anorexia, weight loss, pain. Cardiovascular: no chest pain or dyspnea on exertion Respiratory: no cough, shortness of breath, or wheezing Neurological: no TIA or stroke symptoms Dermatological: negative ENT: negative Skin: Negative. Gastrointestinal: no abdominal pain, change in bowel habits, or black or bloody stools Genito-Urinary: no dysuria, trouble voiding, or hematuria Hematological and Lymphatic: negative Breast: negative Musculoskeletal: negative Remaining ROS negative. Physical Exam: Blood pressure 114/79, pulse 64, temperature 97.2 F (36.2 C), temperature source Oral, height 5\' 6"  (1.676 m), weight 216 lb 1.6 oz (98.022 kg). ECOG: 1 General appearance: alert Head: Normocephalic, without obvious abnormality, atraumatic Neck: no adenopathy, no carotid bruit, no JVD, supple, symmetrical, trachea midline and thyroid not enlarged, symmetric, no tenderness/mass/nodules Lymph nodes: Cervical, supraclavicular, and axillary nodes normal. Heart:regular rate and rhythm, S1, S2 normal, no murmur, click, rub or gallop Lung:chest clear, no wheezing, rales, normal symmetric air entry Abdomin: soft, non-tender, without masses or organomegaly EXT:no erythema, induration, or nodules   Lab Results: Lab Results  Component Value Date   WBC 6.1 09/18/2011   HGB 12.1 09/18/2011   HCT 37.0 09/18/2011   MCV 85.5 09/18/2011   PLT 316 09/18/2011     Chemistry      Component Value Date/Time   NA 137 05/01/2011 0941   K 3.8 05/01/2011 0941   CL 100 05/01/2011 0941   CO2 29 05/01/2011 0941   BUN 10 05/01/2011 0941   CREATININE 0.96 05/01/2011 0941      Component  Value Date/Time   CALCIUM 9.8 05/01/2011 0941   ALKPHOS 66 09/07/2010 0839   ALKPHOS 66 09/07/2010 0839   AST 18 09/07/2010 0839   AST 18 09/07/2010 0839   ALT 11 09/07/2010 0839   ALT 11 09/07/2010 0839   BILITOT 0.3 09/07/2010 0839   BILITOT 0.3 09/07/2010 0839      Impression and Plan:  A 46 year old female  with the following issues: 1.  Iron deficiency anemia. She is doing a lot better since her surgery. Her Hgb is normal now. I will stop her Fe replacement and check iron stores in 6 months. If she develops anemia then and I will resume her po Fe supplement.  2.  Heavy menstrual cycle.  This have resolved after due to her hysterectomy.     Fredia Chittenden, MD 4/2/201310:50 AM

## 2011-09-18 NOTE — Telephone Encounter (Signed)
gv pt appt schedule for oct.  °

## 2012-01-24 ENCOUNTER — Encounter: Payer: Self-pay | Admitting: Sports Medicine

## 2012-01-24 ENCOUNTER — Ambulatory Visit (INDEPENDENT_AMBULATORY_CARE_PROVIDER_SITE_OTHER): Payer: 59 | Admitting: Sports Medicine

## 2012-01-24 VITALS — BP 117/84 | HR 69 | Ht 66.0 in | Wt 230.0 lb

## 2012-01-24 DIAGNOSIS — M7660 Achilles tendinitis, unspecified leg: Secondary | ICD-10-CM

## 2012-01-24 MED ORDER — MELOXICAM 15 MG PO TABS: 15.0000 mg | ORAL_TABLET | Freq: Every day | ORAL | Status: DC

## 2012-01-24 NOTE — Patient Instructions (Addendum)
Thank you for coming in today. Please take the meloxicam daily for 2 weeks.  You can take it longer than two weeks as needed. If you do please take it with zantac OTC for acid blocking.  Do not take with ibuprofen or alieve. You can take it with tylenol.  Wear the lifts.  Do the exercises.  Do 3 sets of 15 reps daily or twice a day.  Ice is ok for pain.  Come back in 1 month for a recheck.

## 2012-01-24 NOTE — Assessment & Plan Note (Addendum)
Insertional tendinitis bilaterally.  Left worse than right based on symptoms and increased thickness and edema.   Plan:  Eccentric exercises (heel lifts) Heel lift in shoe with sports insoles. Meloxicam for 2 weeks.  If not improved would consider re ultrasound along with nitroglycerin patches protocol Followup in one month

## 2012-01-24 NOTE — Progress Notes (Signed)
Denise Rogers is a 46 y.o. female who presents to [facility] today for bilateral Achilles pain.  Pain present in both posterior heels for about one month. Left is worse than right. Pain has been present longer off-and-on however has worsened recently.  She notes pain especially with the first step in the morning.  She denies any pain in the plantar aspect of her foot or heel.  She has pain after working all day.  This has been evaluated by her primary care doctor who prescribed Voltaren gel which has not been effective. Aleve is somewhat effective but not much.  No fevers chills weight loss loss of sensation difficulty walking numbness or weakness.    PMH reviewed. Significant for hypertension. Works at [employer] where she walks on concrete floors all day. History  Substance Use Topics  . Smoking status: Never Smoker   . Smokeless tobacco: Never Used  . Alcohol Use: No   ROS as above otherwise neg   Exam:  BP 117/84  Pulse 69  Ht 5\' 6"  (1.676 m)  Wt 230 lb (104.327 kg)  BMI 37.12 kg/m2 Gen: Well NAD Heart: No edema vasculature 2+ pulses Lungs: Normal work of breathing MSK: Left foot: Normal-appearing no significant swelling.  Tender at the Achilles insertion.  Nontender over the calcaneus plantar surface.  Normal motion. Normal strength.  Sensation intact. 2+ pulses and normal capillary refill.   Right foot:  Normal-appearing no significant swelling.  Tender at the Achilles insertion.  Nontender over the calcaneus plantar surface.  Normal motion. Normal strength.  Sensation intact. 2+ pulses and normal capillary refill.   Musculoskeletal ultrasound:  Right Achilles tendon:  Long and short views.  Achilles tendon maximal thickness 0.6.  Increased thickness associated with edema and increased neovessels at the insertion.  Additionally spurring noted at the posterior calcaneal Achilles tendon insertion.  Long and short views. Left Achilles tendon  Achilles tendon  maximal thickness 0.9.  Increased thickness associated with edema and increased neovessels at the insertion.  Additionally spurring noted at the posterior calcaneal Achilles tendon insertion.

## 2012-02-25 ENCOUNTER — Ambulatory Visit (INDEPENDENT_AMBULATORY_CARE_PROVIDER_SITE_OTHER): Payer: 59 | Admitting: Sports Medicine

## 2012-02-25 VITALS — BP 121/82 | Ht 67.0 in | Wt 230.0 lb

## 2012-02-25 DIAGNOSIS — M6788 Other specified disorders of synovium and tendon, other site: Secondary | ICD-10-CM

## 2012-02-25 DIAGNOSIS — M7660 Achilles tendinitis, unspecified leg: Secondary | ICD-10-CM

## 2012-02-25 MED ORDER — NITROGLYCERIN 0.2 MG/HR TD PT24: MEDICATED_PATCH | TRANSDERMAL | Status: DC

## 2012-02-25 NOTE — Progress Notes (Signed)
  Subjective:    Patient ID: Denise Rogers, female    DOB: [birth year], 46 y.o.   MRN: [id number]  HPI Patient comes in today for followup on bilateral Achilles tendinopathy. Still struggling despite home exercise program and meloxicam. Left heel is more painful than the right. She continues to localize all the pain along the left Achilles tendon. She has been using her heel lifts.    Review of Systems     Objective:   Physical Exam Well-developed, well-nourished. No acute distress there  Bilateral Achilles tenderness to palpation with slight thickening of the left. No palpable defect. No significant soft tissue swelling. Neurovascular intact distally. Walking with a slight limp.       Assessment & Plan:  1. Bilateral Achilles tendinopathy, left greater than right   Were going to start the patient on a topical nitroglycerin protocol for more symptomatically left Achilles tendon. She will apply a quarter patch daily and will continue with her [employer] eccentric heel drop program. Continue with her heel lifts as well. Followup in 4 weeks. We will plan on repeating her ultrasound at that visit.

## 2012-03-03 ENCOUNTER — Other Ambulatory Visit: Payer: Self-pay | Admitting: Family Medicine

## 2012-03-03 DIAGNOSIS — Z1231 Encounter for screening mammogram for malignant neoplasm of breast: Secondary | ICD-10-CM

## 2012-03-19 ENCOUNTER — Other Ambulatory Visit (HOSPITAL_BASED_OUTPATIENT_CLINIC_OR_DEPARTMENT_OTHER): Payer: 59 | Admitting: Lab

## 2012-03-19 ENCOUNTER — Ambulatory Visit (HOSPITAL_BASED_OUTPATIENT_CLINIC_OR_DEPARTMENT_OTHER): Payer: 59 | Admitting: Oncology

## 2012-03-19 VITALS — BP 110/76 | HR 66 | Temp 98.5°F | Resp 20 | Ht 67.0 in | Wt 221.3 lb

## 2012-03-19 DIAGNOSIS — D509 Iron deficiency anemia, unspecified: Secondary | ICD-10-CM

## 2012-03-19 DIAGNOSIS — D649 Anemia, unspecified: Secondary | ICD-10-CM

## 2012-03-19 LAB — COMPREHENSIVE METABOLIC PANEL (CC13)
ALT: 16 U/L (ref 0–55)
Albumin: 3.4 g/dL — ABNORMAL LOW (ref 3.5–5.0)
CO2: 24 mEq/L (ref 22–29)
Calcium: 9.3 mg/dL (ref 8.4–10.4)
Chloride: 106 mEq/L (ref 98–107)
Glucose: 114 mg/dl — ABNORMAL HIGH (ref 70–99)
Potassium: 4 mEq/L (ref 3.5–5.1)
Sodium: 140 mEq/L (ref 136–145)
Total Bilirubin: 0.6 mg/dL (ref 0.20–1.20)
Total Protein: 6.5 g/dL (ref 6.4–8.3)

## 2012-03-19 LAB — CBC WITH DIFFERENTIAL/PLATELET
Basophils Absolute: 0 10*3/uL (ref 0.0–0.1)
EOS%: 1.8 % (ref 0.0–7.0)
Eosinophils Absolute: 0.1 10*3/uL (ref 0.0–0.5)
HGB: 11.6 g/dL (ref 11.6–15.9)
LYMPH%: 29.9 % (ref 14.0–49.7)
MCH: 28.2 pg (ref 25.1–34.0)
MCV: 84.1 fL (ref 79.5–101.0)
MONO%: 5.4 % (ref 0.0–14.0)
NEUT#: 4.4 10*3/uL (ref 1.5–6.5)
NEUT%: 62.4 % (ref 38.4–76.8)
Platelets: 298 10*3/uL (ref 145–400)

## 2012-03-19 LAB — CHCC SMEAR

## 2012-03-19 NOTE — Progress Notes (Signed)
Mailed updated medication list to patient's home 

## 2012-03-19 NOTE — Progress Notes (Signed)
Hematology and Oncology Follow Up Visit  Denise Rogers [id number] [birth year] 46 y.o. 03/19/2012 9:44 AM  CC: Denise Palmer, MD  Principle Diagnosis: :  This is a 46 year old female with iron deficiency anemia due to heavy menstrual cycles her GI workup did not reveal any acute blood loss.  She does have uterine fibroids.  Current therapy: She stopped Fe supplement after her hysterectomy.   Interim History: [name] presents today for a follow-up visit.  She is a pleasant 46 year old female with iron deficiency anemia due to heavy menstrual cycles.She under went a hysterectomy in 04/2011 and have done wells since. She has not reported any major changes in her performance status or activity level.  She has not reported any deterioration in her health. She is not taking any Fe at this time.    Medications: I have reviewed the patient's current medications. Current outpatient prescriptions:aspirin EC 81 MG tablet, Take 81 mg by mouth daily.  , Disp: , Rfl: ;  Calcium Carbonate-Vitamin D (CALCIUM + D) 600-200 MG-UNIT TABS, Take 1 tablet by mouth daily.  , Disp: , Rfl: ;  lisinopril-hydrochlorothiazide (PRINZIDE,ZESTORETIC) 10-12.5 MG per tablet, Take 1 tablet by mouth daily.  , Disp: , Rfl: ;  Multiple Vitamins-Minerals (MULTIVITAMIN WITH MINERALS) tablet, Take 1 tablet by mouth daily.  , Disp: , Rfl:  nitroGLYCERIN (NITRODUR - DOSED IN MG/24 HR) 0.2 mg/hr, Use 1/4 of a patch to the left achilles for 24 hours, Disp: 30 patch, Rfl: 11;  omega-3 acid ethyl esters (LOVAZA) 1 G capsule, Take 1 g by mouth daily.  , Disp: , Rfl: ;  Potassium 99 MG TABS, Take 1 tablet by mouth daily.  , Disp: , Rfl: ;  VOLTAREN 1 % GEL, Apply topically as needed., Disp: , Rfl:   Allergies: No Known Allergies  Past Medical History, Surgical history, Social history, and Family History were reviewed and updated.  Review of Systems: Constitutional:  Negative for fever, chills, night sweats, anorexia, weight  loss, pain. Cardiovascular: no chest pain or dyspnea on exertion Respiratory: no cough, shortness of breath, or wheezing Neurological: no TIA or stroke symptoms Dermatological: negative ENT: negative Skin: Negative. Gastrointestinal: no abdominal pain, change in bowel habits, or black or bloody stools Genito-Urinary: no dysuria, trouble voiding, or hematuria Hematological and Lymphatic: negative Breast: negative Musculoskeletal: negative Remaining ROS negative. Physical Exam: Blood pressure 110/76, pulse 66, temperature 98.5 F (36.9 C), temperature source Oral, resp. rate 20, height 5\' 7"  (1.702 m), weight 221 lb 4.8 oz (100.381 kg). ECOG: 1 General appearance: alert Head: Normocephalic, without obvious abnormality, atraumatic Neck: no adenopathy, no carotid bruit, no JVD, supple, symmetrical, trachea midline and thyroid not enlarged, symmetric, no tenderness/mass/nodules Lymph nodes: Cervical, supraclavicular, and axillary nodes normal. Heart:regular rate and rhythm, S1, S2 normal, no murmur, click, rub or gallop Lung:chest clear, no wheezing, rales, normal symmetric air entry Abdomin: soft, non-tender, without masses or organomegaly EXT:no erythema, induration, or nodules   Lab Results: Lab Results  Component Value Date   WBC 7.1 03/19/2012   HGB 11.6 03/19/2012   HCT 34.8 03/19/2012   MCV 84.1 03/19/2012   PLT 298 03/19/2012     Chemistry      Component Value Date/Time   NA 137 05/01/2011 0941   K 3.8 05/01/2011 0941   CL 100 05/01/2011 0941   CO2 29 05/01/2011 0941   BUN 10 05/01/2011 0941   CREATININE 0.96 05/01/2011 0941      Component Value Date/Time   CALCIUM 9.8  05/01/2011 0941   ALKPHOS 66 09/07/2010 0839   ALKPHOS 66 09/07/2010 0839   AST 18 09/07/2010 0839   AST 18 09/07/2010 0839   ALT 11 09/07/2010 0839   ALT 11 09/07/2010 0839   BILITOT 0.3 09/07/2010 0839   BILITOT 0.3 09/07/2010 0839      Impression and Plan:  A 46 year old female with the following  issues: 1.  Iron deficiency anemia. She is doing a lot better since her surgery. Her Hgb is normal now. No need for Fe supplement at this time unless she develops Fe deficiency  again.  2.  Heavy menstrual cycle.  This have resolved after due to her hysterectomy.  3. Follow up will be as needed.     [facility], MD 10/2/20139:44 AM

## 2012-03-26 ENCOUNTER — Ambulatory Visit (INDEPENDENT_AMBULATORY_CARE_PROVIDER_SITE_OTHER): Payer: 59 | Admitting: Sports Medicine

## 2012-03-26 ENCOUNTER — Encounter: Payer: Self-pay | Admitting: Sports Medicine

## 2012-03-26 VITALS — BP 125/85 | HR 68 | Ht 66.0 in | Wt 221.0 lb

## 2012-03-26 DIAGNOSIS — M7660 Achilles tendinitis, unspecified leg: Secondary | ICD-10-CM

## 2012-03-26 NOTE — Progress Notes (Signed)
  Subjective:    Patient ID: Denise Rogers, female    DOB: 1966-05-22, 46 y.o.   MRN: [id number]  HPI Patient comes in today for followup on bilateral Achilles tendinopathy. She's using a nitroglycerin patch on the left Achilles tendon. She's noticed about a 10% improvement in her symptoms on this side. Right Achilles is not as bad. She is using her heel lifts in her shoes and doing her home exercises.    Review of Systems     Objective:   Physical Exam Well-developed well-nourished. No acute distress. Awake alert and oriented x3  Right Achilles is tender to palpation just proximal to the insertion of the calcaneus. Minimal thickening. No appreciable nodularity. Left Achilles is also tender to palpation just proximal to the insertion of the calcaneus. Mild swelling of the tendon but not as pronounced as on previous visit. No nodularity. She is neurovascular intact distally walking with a slight limp.  MSK ultrasound of each of her Achilles tendons was performed. Images were obtained in both the transverse and longitudinal planes. The left Achilles tendon measures 0.75 cm which is an improvement over her previous scan. The right Achilles tendon measures 0.91 cm which is actually a little thicker than on her previous scan even though this is less than dramatic. Each of the Achilles tendons shows edema just proximal to the insertion of the calcaneus, but I do not appreciate any discrete tears.       Assessment & Plan:  1. Bilateral Achilles tendinopathy  It is interesting that the right Achilles tendon is a little thicker on today's ultrasound than on her ultrasound one month ago. For this reason I will have her use one quarter patch nitroglycerin on this Achilles tendon and continue with one quarter nitroglycerin patch on the left tendon as well. She will continue with her heel lifts and continue with her eccentric exercises. She will followup with me in 4 weeks for repeat ultrasound  and will call with questions or concerns in the interim.

## 2012-04-03 ENCOUNTER — Ambulatory Visit
Admission: RE | Admit: 2012-04-03 | Discharge: 2012-04-03 | Disposition: A | Discharge status: Home / Self Care | Payer: 59 | Source: Ambulatory Visit | Attending: Family Medicine | Admitting: Family Medicine

## 2012-04-03 DIAGNOSIS — Z1231 Encounter for screening mammogram for malignant neoplasm of breast: Secondary | ICD-10-CM

## 2012-04-23 ENCOUNTER — Ambulatory Visit (INDEPENDENT_AMBULATORY_CARE_PROVIDER_SITE_OTHER): Payer: 59 | Admitting: Sports Medicine

## 2012-04-23 ENCOUNTER — Encounter: Payer: Self-pay | Admitting: Sports Medicine

## 2012-04-23 VITALS — BP 108/75 | HR 69 | Ht 66.0 in | Wt 221.0 lb

## 2012-04-23 DIAGNOSIS — M7660 Achilles tendinitis, unspecified leg: Secondary | ICD-10-CM

## 2012-04-23 NOTE — Progress Notes (Signed)
  Subjective:    Patient ID: Denise Rogers, female    DOB: [birth year], 46 y.o.   MRN: [id number]  HPI Patient comes in today for followup on bilateral Achilles tendinopathy. She is about 60% improved. She is happy with her progress to date. She's using a quarter patch of nitroglycerin over each Achilles tendon and has not experienced any side effects from this. She is doing daily eccentric heel drops. She is wearing her heel lifts in her shoes. She is without complaint today.    Review of Systems     Objective:   Physical Exam Well-developed, well-nourished. No acute distress. Awake alert and oriented x3  Right Achilles tendon shows no real tenderness to palpation. No swelling. No pain with Achilles stretch. Left Achilles tendon shows slight tenderness to palpation just above the insertion on the calcaneus. Minimal swelling. No pain with Achilles stretch. Neurovascularly intact distally.  Patient walks without a significant limp.  MSK ultrasound of each of the Achilles tendons was performed. Images were obtained in both the longitudinal and transverse planes. Overall the appearance of the Achilles tendons is improved when compared to prior scans. Left Achilles tendon measures 0.64cm in the longitudinal plane. This is in comparison to a measurement of 0.75cm at her last visit 4 weeks ago. Right Achilles tendon measures 0.68cm in the longitudinal plane. This is an improvement from 0.91cm. both Achilles still shows some areas of hypoechogenicity just proximal to the insertion of the calcaneus. This is seen both in the longitudinal and transverse planes. No significant neovascularity.       Assessment & Plan:  1. Improving bilateral Achilles tendinopathy  Continue with nitroglycerin protocol for each Achilles tendon. Continue with home exercise program. Continue to wear heel lifts in each of your shoes. Patient has shown improvement both clinically as well as with ultrasound imaging. I  will reevaluate her in 4 weeks with repeat ultrasound.

## 2012-05-20 ENCOUNTER — Ambulatory Visit (INDEPENDENT_AMBULATORY_CARE_PROVIDER_SITE_OTHER): Payer: 59 | Admitting: Sports Medicine

## 2012-05-20 ENCOUNTER — Encounter: Payer: Self-pay | Admitting: Sports Medicine

## 2012-05-20 VITALS — BP 121/85 | HR 65 | Ht 65.0 in | Wt 221.0 lb

## 2012-05-20 DIAGNOSIS — M7660 Achilles tendinitis, unspecified leg: Secondary | ICD-10-CM

## 2012-05-20 DIAGNOSIS — M6788 Other specified disorders of synovium and tendon, other site: Secondary | ICD-10-CM

## 2012-05-20 NOTE — Progress Notes (Signed)
  Subjective:    Patient ID: Denise Rogers, female    DOB: 08-Aug-1965, 46 y.o.   MRN: [id number]  HPI Patient comes in today for followup on her bilateral Achilles tendinopathy. Overall she is 80% improved. She continues with her heel lifts. She has been on topical nitroglycerin for 3 months. She has been compliant with her home exercises. She is pleased with her progress to date.    Review of Systems     Objective:   Physical Exam  There is no tenderness to palpation along either Achilles tendon. No pain with Achilles stretching. No soft tissue swelling. Neurovascular intact distally. Walking without significant limp.  MSK ultrasound of each of the Achilles tendons was performed. Overall, there has not been much change in the Achilles thickness on the [facility] when compared to her last ultrasound. The left Achilles tendon measures 0.64 cm in the right Achilles tendon measures 0.68 cm. I do not appreciate any tearing of the tendon either on long or short view.        Assessment & Plan:  1. Improving bilateral Achilles tendinopathy  Clinically the patient is doing very well. I suspect that the Achilles thickness seen on today's ultrasound is her baseline. I think she can discontinue her nitroglycerin patches but I want her to continue with her heel lifts and with her daily eccentric home exercises. At this point she can continue to increase activity as tolerated and I will discharge her from my care to followup when necessary.

## 2012-06-30 HISTORY — PX: OTHER SURGICAL HISTORY: SHX169

## 2013-03-09 ENCOUNTER — Other Ambulatory Visit: Payer: Self-pay

## 2013-03-09 DIAGNOSIS — Z1231 Encounter for screening mammogram for malignant neoplasm of breast: Secondary | ICD-10-CM

## 2013-04-07 ENCOUNTER — Ambulatory Visit
Admission: RE | Admit: 2013-04-07 | Discharge: 2013-04-07 | Disposition: A | Discharge status: Home / Self Care | Payer: 59 | Source: Ambulatory Visit

## 2013-04-07 DIAGNOSIS — Z1231 Encounter for screening mammogram for malignant neoplasm of breast: Secondary | ICD-10-CM

## 2014-01-14 ENCOUNTER — Other Ambulatory Visit: Payer: Self-pay | Admitting: Orthopedic Surgery

## 2014-01-26 ENCOUNTER — Encounter (HOSPITAL_COMMUNITY): Payer: Self-pay | Admitting: Pharmacy Technician

## 2014-01-28 ENCOUNTER — Ambulatory Visit (HOSPITAL_COMMUNITY)
Admission: RE | Admit: 2014-01-28 | Discharge: 2014-01-28 | Disposition: A | Discharge status: Home / Self Care | Payer: 59 | Source: Ambulatory Visit | Attending: Anesthesiology | Admitting: Anesthesiology

## 2014-01-28 ENCOUNTER — Encounter (HOSPITAL_COMMUNITY): Payer: Self-pay

## 2014-01-28 ENCOUNTER — Encounter (HOSPITAL_COMMUNITY)
Admission: RE | Admit: 2014-01-28 | Discharge: 2014-01-28 | Disposition: A | Discharge status: Home / Self Care | Payer: 59 | Source: Ambulatory Visit | Attending: Orthopedic Surgery | Admitting: Orthopedic Surgery

## 2014-01-28 DIAGNOSIS — I10 Essential (primary) hypertension: Secondary | ICD-10-CM | POA: Insufficient documentation

## 2014-01-28 DIAGNOSIS — Z01818 Encounter for other preprocedural examination: Secondary | ICD-10-CM | POA: Diagnosis present

## 2014-01-28 LAB — CBC
HCT: 34.8 % — ABNORMAL LOW (ref 36.0–46.0)
HEMOGLOBIN: 11.4 g/dL — AB (ref 12.0–15.0)
MCH: 26.8 pg (ref 26.0–34.0)
MCHC: 32.8 g/dL (ref 30.0–36.0)
MCV: 81.9 fL (ref 78.0–100.0)
Platelets: 356 10*3/uL (ref 150–400)
RBC: 4.25 MIL/uL (ref 3.87–5.11)
RDW: 14.9 % (ref 11.5–15.5)
WBC: 7.4 10*3/uL (ref 4.0–10.5)

## 2014-01-28 LAB — COMPREHENSIVE METABOLIC PANEL
ALBUMIN: 3.6 g/dL (ref 3.5–5.2)
ALK PHOS: 79 U/L (ref 39–117)
ALT: 15 U/L (ref 0–35)
ANION GAP: 11 (ref 5–15)
AST: 16 U/L (ref 0–37)
BILIRUBIN TOTAL: 0.2 mg/dL — AB (ref 0.3–1.2)
BUN: 15 mg/dL (ref 6–23)
CHLORIDE: 102 meq/L (ref 96–112)
CO2: 25 meq/L (ref 19–32)
Calcium: 9 mg/dL (ref 8.4–10.5)
Creatinine, Ser: 0.8 mg/dL (ref 0.50–1.10)
GFR calc Af Amer: >90 mL/min (ref >90)
GFR, EST NON AFRICAN AMERICAN: 85 mL/min — AB (ref >90)
GLUCOSE: 102 mg/dL — AB (ref 70–99)
POTASSIUM: 3.9 meq/L (ref 3.7–5.3)
Sodium: 138 mEq/L (ref 137–147)
Total Protein: 7 g/dL (ref 6.0–8.3)

## 2014-01-28 LAB — PROTIME-INR
INR: 1.06 (ref 0.00–1.49)
Prothrombin Time: 13.8 seconds (ref 11.6–15.2)

## 2014-01-28 LAB — URINALYSIS, ROUTINE W REFLEX MICROSCOPIC
BILIRUBIN URINE: NEGATIVE
Glucose, UA: NEGATIVE mg/dL
HGB URINE DIPSTICK: NEGATIVE
Ketones, ur: NEGATIVE mg/dL
Leukocytes, UA: NEGATIVE
Nitrite: NEGATIVE
PROTEIN: NEGATIVE mg/dL
Specific Gravity, Urine: 1.019 (ref 1.005–1.030)
Urobilinogen, UA: 1 mg/dL (ref 0.0–1.0)
pH: 6 (ref 5.0–8.0)

## 2014-01-28 LAB — SURGICAL PCR SCREEN
MRSA, PCR: INVALID — AB
Staphylococcus aureus: INVALID — AB

## 2014-01-28 LAB — APTT: APTT: 32 s (ref 24–37)

## 2014-01-28 NOTE — Patient Instructions (Addendum)
YOUR SURGERY IS SCHEDULED AT [facility]  ON:  Monday  8/24  REPORT TO  SHORT STAY CENTER AT:  9:40 AM   PLEASE COME IN THE [place] ENTRANCE AND FOLLOW SIGNS TO SHORT STAY CENTER.  DO NOT EAT OR DRINK ANYTHING AFTER MIDNIGHT THE NIGHT BEFORE YOUR SURGERY.  YOU MAY BRUSH YOUR TEETH, RINSE OUT YOUR MOUTH--BUT NO WATER, NO FOOD, NO CHEWING GUM, NO MINTS, NO CANDIES, NO CHEWING TOBACCO.  PLEASE TAKE THE FOLLOWING MEDICATIONS THE AM OF YOUR SURGERY WITH A FEW SIPS OF WATER:  NO MEDICATIONS TO TAKE THE DAY OF SURGERY.  DO NOT BRING VALUABLES, MONEY, CREDIT CARDS.  DO NOT WEAR JEWELRY, MAKE-UP, NAIL POLISH AND NO METAL PINS OR CLIPS IN YOUR HAIR. CONTACT LENS, DENTURES / PARTIALS, GLASSES SHOULD NOT BE WORN TO SURGERY AND IN MOST CASES-HEARING AIDS WILL NEED TO BE REMOVED.  BRING YOUR GLASSES CASE, ANY EQUIPMENT NEEDED FOR YOUR CONTACT LENS. FOR PATIENTS ADMITTED TO THE HOSPITAL--CHECK OUT TIME THE DAY OF DISCHARGE IS 11:00 AM.  ALL INPATIENT ROOMS ARE PRIVATE - WITH BATHROOM, TELEPHONE, TELEVISION AND WIFI INTERNET.                                                    PLEASE READ OVER ANY  FACT SHEETS THAT YOU WERE GIVEN: MRSA INFORMATION, BLOOD TRANSFUSION INFORMATION, INCENTIVE SPIROMETER INFORMATION.  PLEASE BE AWARE THAT YOU MAY NEED ADDITIONAL BLOOD DRAWN DAY OF YOUR SURGERY  _______________________________________________________________________   [facility] - Preparing for Surgery Before surgery, you can play an important role.  Because skin is not sterile, your skin needs to be as free of germs as possible.  You can reduce the number of germs on your skin by washing with CHG (chlorahexidine gluconate) soap before surgery.  CHG is an antiseptic cleaner which kills germs and bonds with the skin to continue killing germs even after washing. Please DO NOT use if you have an allergy to CHG or antibacterial soaps.  If your skin becomes reddened/irritated stop using  the CHG and inform your nurse when you arrive at Short Stay. Do not shave (including legs and underarms) for at least 48 hours prior to the first CHG shower.  You may shave your face/neck. Please follow these instructions carefully:  1.  Shower with CHG Soap the night before surgery and the  morning of Surgery.  2.  If you choose to wash your hair, wash your hair first as usual with your  normal  shampoo.  3.  After you shampoo, rinse your hair and body thoroughly to remove the  shampoo.                           4.  Use CHG as you would any other liquid soap.  You can apply chg directly  to the skin and wash                       Gently with a scrungie or clean washcloth.  5.  Apply the CHG Soap to your body ONLY FROM THE NECK DOWN.   Do not use on face/ open  Wound or open sores. Avoid contact with eyes, ears mouth and genitals (private parts).                       Wash face,  Genitals (private parts) with your normal soap.             6.  Wash thoroughly, paying special attention to the area where your surgery  will be performed.  7.  Thoroughly rinse your body with warm water from the neck down.  8.  DO NOT shower/wash with your normal soap after using and rinsing off  the CHG Soap.                9.  Pat yourself dry with a clean towel.            10.  Wear clean pajamas.            11.  Place clean sheets on your bed the night of your first shower and do not  sleep with pets. Day of Surgery : Do not apply any lotions/deodorants the morning of surgery.  Please wear clean clothes to the hospital/surgery center.  FAILURE TO FOLLOW THESE INSTRUCTIONS MAY RESULT IN THE CANCELLATION OF YOUR SURGERY PATIENT SIGNATURE_________________________________  NURSE SIGNATURE__________________________________  ________________________________________________________________________   Adam Phenix  An incentive spirometer is a tool that can help keep your lungs  clear and active. This tool measures how well you are filling your lungs with each breath. Taking long deep breaths may help reverse or decrease the chance of developing breathing (pulmonary) problems (especially infection) following:  A long period of time when you are unable to move or be active. BEFORE THE PROCEDURE   If the spirometer includes an indicator to show your best effort, your nurse or respiratory therapist will set it to a desired goal.  If possible, sit up straight or lean slightly forward. Try not to slouch.  Hold the incentive spirometer in an upright position. INSTRUCTIONS FOR USE  1. Sit on the edge of your bed if possible, or sit up as far as you can in bed or on a chair. 2. Hold the incentive spirometer in an upright position. 3. Breathe out normally. 4. Place the mouthpiece in your mouth and seal your lips tightly around it. 5. Breathe in slowly and as deeply as possible, raising the piston or the ball toward the top of the column. 6. Hold your breath for 3-5 seconds or for as long as possible. Allow the piston or ball to fall to the bottom of the column. 7. Remove the mouthpiece from your mouth and breathe out normally. 8. Rest for a few seconds and repeat Steps 1 through 7 at least 10 times every 1-2 hours when you are awake. Take your time and take a few normal breaths between deep breaths. 9. The spirometer may include an indicator to show your best effort. Use the indicator as a goal to work toward during each repetition. 10. After each set of 10 deep breaths, practice coughing to be sure your lungs are clear. If you have an incision (the cut made at the time of surgery), support your incision when coughing by placing a pillow or rolled up towels firmly against it. Once you are able to get out of bed, walk around indoors and cough well. You may stop using the incentive spirometer when instructed by your caregiver.  RISKS AND COMPLICATIONS  Take your time so you do  not  get dizzy or light-headed.  If you are in pain, you may need to take or ask for pain medication before doing incentive spirometry. It is harder to take a deep breath if you are having pain. AFTER USE  Rest and breathe slowly and easily.  It can be helpful to keep track of a log of your progress. Your caregiver can provide you with a simple table to help with this. If you are using the spirometer at home, follow these instructions: Scraper IF:   You are having difficultly using the spirometer.  You have trouble using the spirometer as often as instructed.  Your pain medication is not giving enough relief while using the spirometer.  You develop fever of 100.5 F (38.1 C) or higher. SEEK IMMEDIATE MEDICAL CARE IF:   You cough up bloody sputum that had not been present before.  You develop fever of 102 F (38.9 C) or greater.  You develop worsening pain at or near the incision site. MAKE SURE YOU:   Understand these instructions.  Will watch your condition.  Will get help right away if you are not doing well or get worse. Document Released: 10/15/2006 Document Revised: 08/27/2011 Document Reviewed: 12/16/2006 Exit[employer] Patient Information 2014 [employer], [place].   ________________________________________________________________________  WHAT IS A BLOOD TRANSFUSION? Blood Transfusion Information  A transfusion is the replacement of blood or some of its parts. Blood is made up of multiple cells which provide different functions.  Red blood cells carry oxygen and are used for blood loss replacement.  White blood cells fight against infection.  Platelets control bleeding.  Plasma helps clot blood.  Other blood products are available for specialized needs, such as hemophilia or other clotting disorders. BEFORE THE TRANSFUSION  Who gives blood for transfusions?   Healthy volunteers who are fully evaluated to make sure their blood is safe. This is blood bank  blood. Transfusion therapy is the safest it has ever been in the practice of medicine. Before blood is taken from a donor, a complete history is taken to make sure that person has no history of diseases nor engages in risky social behavior (examples are intravenous drug use or sexual activity with multiple partners). The donor's travel history is screened to minimize risk of transmitting infections, such as malaria. The donated blood is tested for signs of infectious diseases, such as HIV and hepatitis. The blood is then tested to be sure it is compatible with you in order to minimize the chance of a transfusion reaction. If you or a relative donates blood, this is often done in anticipation of surgery and is not appropriate for emergency situations. It takes many days to process the donated blood. RISKS AND COMPLICATIONS Although transfusion therapy is very safe and saves many lives, the main dangers of transfusion include:   Getting an infectious disease.  Developing a transfusion reaction. This is an allergic reaction to something in the blood you were given. Every precaution is taken to prevent this. The decision to have a blood transfusion has been considered carefully by your caregiver before blood is given. Blood is not given unless the benefits outweigh the risks. AFTER THE TRANSFUSION  Right after receiving a blood transfusion, you will usually feel much better and more energetic. This is especially true if your red blood cells have gotten low (anemic). The transfusion raises the level of the red blood cells which carry oxygen, and this usually causes an energy increase.  The nurse administering the transfusion will  monitor you carefully for complications. HOME CARE INSTRUCTIONS  No special instructions are needed after a transfusion. You may find your energy is better. Speak with your caregiver about any limitations on activity for underlying diseases you may have. SEEK MEDICAL CARE IF:    Your condition is not improving after your transfusion.  You develop redness or irritation at the intravenous (IV) site. SEEK IMMEDIATE MEDICAL CARE IF:  Any of the following symptoms occur over the next 12 hours:  Shaking chills.  You have a temperature by mouth above 102 F (38.9 C), not controlled by medicine.  Chest, back, or muscle pain.  People around you feel you are not acting correctly or are confused.  Shortness of breath or difficulty breathing.  Dizziness and fainting.  You get a rash or develop hives.  You have a decrease in urine output.  Your urine turns a dark color or changes to pink, red, or brown. Any of the following symptoms occur over the next 10 days:  You have a temperature by mouth above 102 F (38.9 C), not controlled by medicine.  Shortness of breath.  Weakness after normal activity.  The white part of the eye turns yellow (jaundice).  You have a decrease in the amount of urine or are urinating less often.  Your urine turns a dark color or changes to pink, red, or brown. Document Released: 06/01/2000 Document Revised: 08/27/2011 Document Reviewed: 01/19/2008 [facility] Patient Information 2014 [place], [place].  _______________________________________________________________________

## 2014-01-28 NOTE — Pre-Procedure Instructions (Signed)
EKG AND CXR WERE DONE TODAY - PREOP AT [facility].  MEDICAL CLEARANCE ON CHART FROM DR. Stephanie Acre

## 2014-01-30 LAB — MRSA CULTURE

## 2014-02-07 ENCOUNTER — Other Ambulatory Visit: Payer: Self-pay | Admitting: Orthopedic Surgery

## 2014-02-07 NOTE — H&P (Signed)
Denise Rogers DOB: 12-23-1965 Single / Language: [name] / Race: Black or African American, White Female Date of Admission:  02/08/2014 Chief Complaint: Left Knee Pain History of Present Illness The patient is a 48 year old female who comes in for a preoperative history and physical. The patient is scheduled for a left total knee arthroplasty to be performed by Dr. Dione Plover. Aluisio, MD at [facility] on 02/08/2014. The patient is a 48 year old female who presents with knee complaints. The patient was seen for a second opinion. The patient reports left knee and right knee symptoms including: pain, stiffness, soreness and grinding which began year(s) ago. The patient describes the severity of the symptoms as moderate in severity.The patient feels that the symptoms are worsening. The patient has the current diagnosis of knee osteoarthritis. Prior to being seen today the patient was previously evaluated by a colleague. Previous work-up for this problem has included knee x-rays. Past treatment for this problem has included knee brace, intra-articular injection of corticosteroids (She also finished a series of Supartz in both knees 11 weeks ago) and nonsteroidal anti-inflammatory drugs (Relafen, as well as Celebrex). She states that the left knee bothers her at all times. She has pain in the right also, but the left one is worse. It limits what she can and cannot do. She said the cortisone and Supartz injections have not provided any benefit. She is at a stage now where the knee has effectively taken over her life. She works at [employer] on her feet throughout most of the day. It is getting harder to do her work also because of the pain. They have been treated conservatively in the past for the above stated problem and despite conservative measures, they continue to have progressive pain and severe functional limitations and dysfunction. They have failed non-operative management including home  exercise, medications, and injections. It is felt that they would benefit from undergoing total joint replacement. Risks and benefits of the procedure have been discussed with the patient and they elect to proceed with surgery. There are no active contraindications to surgery such as ongoing infection or rapidly progressive neurological disease.  Problem List Primary osteoarthritis of both knees (715.16  M17.0)  Allergies No Known Drug Allergies  Family History  Rheumatoid Arthritis Mother. Osteoporosis Mother. Osteoarthritis Mother. Osteoarthritis Mother. Anemia Mother. Congestive Heart Failure Mother. Heart Disease Mother. First Degree Relatives reported Hypertension Mother. Heart disease in female family member before age 56 Diabetes Mellitus Mother. Congestive Heart Failure Mother.  Social History  Current work status working full time Children 0 Living situation live alone Exercise Exercises rarely; does running / walking Marital status single Tobacco use Never smoker. 09/24/2013 Tobacco / smoke exposure 09/24/2013: yes No history of drug/alcohol rehab Never consumed alcohol 09/24/2013: Never consumed alcohol Number of flights of stairs before winded 4-5 Not under pain contract Not Previously addicted to/Dependent on drugs or pain medications No History Of Illicit drug use No alcohol use  Medication History One-A-Day Womens (Oral) Active. Lisinopril-Hydrochlorothiazide (10-12.5MG  Tablet, Oral) Active. Aspirin EC (81MG  Tablet DR, Oral) Active. Omega 3 (Oral) Specific dose unknown - Active.   Past Surgical History  Hysterectomy Date: 05/07/2011. complete (non-cancerous) Carpal Tunnel Repair Date: 06/30/2012. right  Past Medical History Anemia Iron deficiency Hypertension  Review of Systems General Not Present- Chills, Fatigue, Fever, Memory Loss, Night Sweats, Weight Gain and Weight Loss. Skin Not Present- Eczema, Hives,  Itching, Lesions and Rash. HEENT Not Present- Dentures, Double Vision, Headache, Hearing Loss,  Tinnitus and Visual Loss. Respiratory Not Present- Allergies, Chronic Cough, Coughing up blood, Shortness of breath at rest and Shortness of breath with exertion. Cardiovascular Not Present- Chest Pain, Difficulty Breathing Lying Down, Murmur, Palpitations, Racing/skipping heartbeats and Swelling. Gastrointestinal Not Present- Abdominal Pain, Bloody Stool, Constipation, Diarrhea, Difficulty Swallowing, Heartburn, Jaundice, Loss of appetitie, Nausea and Vomiting. Female Genitourinary Not Present- Blood in Urine, Discharge, Flank Pain, Incontinence, Painful Urination, Urgency, Urinary frequency, Urinary Retention, Urinating at Night and Weak urinary stream. Musculoskeletal Present- Back Pain and Joint Pain. Not Present- Joint Swelling, Morning Stiffness, Muscle Pain, Muscle Weakness and Spasms. Neurological Not Present- Blackout spells, Difficulty with balance, Dizziness, Paralysis, Tremor and Weakness. Psychiatric Not Present- Insomnia.   Vitals  Pulse: 68 (Regular)  Resp.: 14 (Unlabored)  BP: 122/72 (Sitting, Right Arm, Standard)    Physical Exam  General Mental Status -Alert, cooperative and good historian. General Appearance-pleasant, Not in acute distress. Orientation-Oriented X3. Build & Nutrition-Well nourished and Well developed.  Head and Neck Head-normocephalic, atraumatic . Neck Global Assessment - supple, no bruit auscultated on the right, no bruit auscultated on the left.  Eye Vision-Wears corrective lenses. Pupil - Bilateral-Regular and Round. Motion - Bilateral-EOMI.  Chest and Lung Exam Auscultation Breath sounds - clear at anterior chest wall and clear at posterior chest wall. Adventitious sounds - No Adventitious sounds.  Cardiovascular Auscultation Rhythm - Regular rate and rhythm. Heart Sounds - S1 WNL and S2 WNL. Murmurs & Other Heart Sounds -  Auscultation of the heart reveals - No Murmurs.  Abdomen Palpation/Percussion Tenderness - Abdomen is non-tender to palpation. Rigidity (guarding) - Abdomen is soft. Auscultation Auscultation of the abdomen reveals - Bowel sounds normal.  Female Genitourinary Note: Not done, not pertinent to present illness   Musculoskeletal Note: Musculoskeletal: Evaluation of her hips show normal range of motion with no discomfort.  Extremities: Both knees show no effusion. Left knee range 5 to 125. Marked crepitus on range of motion. Tenderness medial greater than lateral with no instability. Right knee: No effusion, Range 5 to 130. No lateral tenderness. There is some medial tenderness. There is moderate crepitus on range of motion. There is no instability noted. Pulse, sensation, and motor intact both lower extremities. She has an antalgic gait pattern on the left.  X-RAYS: Radiographs AP both knees and lateral show that on the left knee, she has essentially bone on the one medial margin of the medial compartment, and it has bone on bone patellofemoral joint. The right knee has medial narrowing, but not bone on bone.  Assessment & Plan Primary osteoarthritis of both knees (715.16  M17.0) Note:Plan is for a Left Total Knee Replacement by Dr. Wynelle Link.  Plan is to go home versus SNF depending upon her progess.  PCP - Dr. Jonathon Jordan - Patient has been seen preoperatively and felt to be stable for surgery.  The patient does not have any contraindications and will receive TXA (tranexamic acid) prior to surgery.  Signed electronically by Joelene Millin, III PA-C

## 2014-02-08 ENCOUNTER — Inpatient Hospital Stay (HOSPITAL_COMMUNITY)
Admission: RE | Admit: 2014-02-08 | Discharge: 2014-02-10 | DRG: 470 | Disposition: A | Discharge status: Home Health | Payer: 59 | Source: Ambulatory Visit | Attending: Orthopedic Surgery | Admitting: Orthopedic Surgery

## 2014-02-08 ENCOUNTER — Encounter (HOSPITAL_COMMUNITY)
Admission: RE | Disposition: A | Discharge status: Home Health | Payer: Self-pay | Source: Ambulatory Visit | Attending: Orthopedic Surgery

## 2014-02-08 ENCOUNTER — Encounter (HOSPITAL_COMMUNITY): Payer: Self-pay | Admitting: *Deleted

## 2014-02-08 ENCOUNTER — Encounter (HOSPITAL_COMMUNITY): Payer: 59 | Admitting: Certified Registered Nurse Anesthetist

## 2014-02-08 ENCOUNTER — Inpatient Hospital Stay (HOSPITAL_COMMUNITY): Payer: 59 | Admitting: Certified Registered Nurse Anesthetist

## 2014-02-08 DIAGNOSIS — I10 Essential (primary) hypertension: Secondary | ICD-10-CM | POA: Diagnosis present

## 2014-02-08 DIAGNOSIS — Z8262 Family history of osteoporosis: Secondary | ICD-10-CM

## 2014-02-08 DIAGNOSIS — Z6835 Body mass index (BMI) 35.0-35.9, adult: Secondary | ICD-10-CM

## 2014-02-08 DIAGNOSIS — M1712 Unilateral primary osteoarthritis, left knee: Secondary | ICD-10-CM

## 2014-02-08 DIAGNOSIS — M25569 Pain in unspecified knee: Secondary | ICD-10-CM | POA: Diagnosis present

## 2014-02-08 DIAGNOSIS — D509 Iron deficiency anemia, unspecified: Secondary | ICD-10-CM | POA: Diagnosis present

## 2014-02-08 DIAGNOSIS — E669 Obesity, unspecified: Secondary | ICD-10-CM | POA: Diagnosis present

## 2014-02-08 DIAGNOSIS — Z833 Family history of diabetes mellitus: Secondary | ICD-10-CM

## 2014-02-08 DIAGNOSIS — Z8261 Family history of arthritis: Secondary | ICD-10-CM | POA: Diagnosis not present

## 2014-02-08 DIAGNOSIS — M1710 Unilateral primary osteoarthritis, unspecified knee: Principal | ICD-10-CM | POA: Diagnosis present

## 2014-02-08 DIAGNOSIS — E871 Hypo-osmolality and hyponatremia: Secondary | ICD-10-CM | POA: Diagnosis not present

## 2014-02-08 DIAGNOSIS — Z96652 Presence of left artificial knee joint: Secondary | ICD-10-CM

## 2014-02-08 DIAGNOSIS — M179 Osteoarthritis of knee, unspecified: Secondary | ICD-10-CM | POA: Diagnosis present

## 2014-02-08 HISTORY — PX: TOTAL KNEE ARTHROPLASTY: SHX125

## 2014-02-08 LAB — TYPE AND SCREEN
ABO/RH(D): A POS
Antibody Screen: NEGATIVE

## 2014-02-08 LAB — ABO/RH: ABO/RH(D): A POS

## 2014-02-08 SURGERY — ARTHROPLASTY, KNEE, TOTAL
Anesthesia: Spinal | Site: Knee | Laterality: Left

## 2014-02-08 MED ORDER — ONDANSETRON HCL 4 MG/2ML IJ SOLN
INTRAMUSCULAR | Status: DC | PRN
  Administered 2014-02-08: 4 mg via INTRAVENOUS

## 2014-02-08 MED ORDER — CEFAZOLIN SODIUM-DEXTROSE 2-3 GM-% IV SOLR
2.0000 g | Freq: Four times a day (QID) | INTRAVENOUS | Status: AC
  Administered 2014-02-08 – 2014-02-09 (×2): 2 g via INTRAVENOUS
  Filled 2014-02-08 (×2): qty 50

## 2014-02-08 MED ORDER — DOCUSATE SODIUM 100 MG PO CAPS
100.0000 mg | ORAL_CAPSULE | Freq: Two times a day (BID) | ORAL | Status: DC
  Administered 2014-02-08 – 2014-02-10 (×4): 100 mg via ORAL

## 2014-02-08 MED ORDER — FENTANYL CITRATE 0.05 MG/ML IJ SOLN
INTRAMUSCULAR | Status: DC | PRN
  Administered 2014-02-08 (×2): 50 ug via INTRAVENOUS

## 2014-02-08 MED ORDER — TRANEXAMIC ACID 100 MG/ML IV SOLN
1000.0000 mg | INTRAVENOUS | Status: AC
  Administered 2014-02-08: 1000 mg via INTRAVENOUS
  Filled 2014-02-08: qty 10

## 2014-02-08 MED ORDER — ACETAMINOPHEN 650 MG RE SUPP: 650.0000 mg | Freq: Four times a day (QID) | RECTAL | Status: DC | PRN

## 2014-02-08 MED ORDER — MORPHINE SULFATE 2 MG/ML IJ SOLN
1.0000 mg | INTRAMUSCULAR | Status: DC | PRN
  Administered 2014-02-08: 2 mg via INTRAVENOUS
  Filled 2014-02-08: qty 1

## 2014-02-08 MED ORDER — PROMETHAZINE HCL 25 MG/ML IJ SOLN: 6.2500 mg | INTRAMUSCULAR | Status: DC | PRN

## 2014-02-08 MED ORDER — MENTHOL 3 MG MT LOZG
1.0000 | LOZENGE | OROMUCOSAL | Status: DC | PRN
  Filled 2014-02-08: qty 9

## 2014-02-08 MED ORDER — METOCLOPRAMIDE HCL 10 MG PO TABS: 5.0000 mg | ORAL_TABLET | Freq: Three times a day (TID) | ORAL | Status: DC | PRN

## 2014-02-08 MED ORDER — POTASSIUM CHLORIDE IN NACL 20-0.9 MEQ/L-% IV SOLN
INTRAVENOUS | Status: DC
  Administered 2014-02-08: 18:00:00 via INTRAVENOUS
  Filled 2014-02-08 (×2): qty 1000

## 2014-02-08 MED ORDER — ACETAMINOPHEN 10 MG/ML IV SOLN
1000.0000 mg | Freq: Once | INTRAVENOUS | Status: AC
  Administered 2014-02-08: 1000 mg via INTRAVENOUS
  Filled 2014-02-08: qty 100

## 2014-02-08 MED ORDER — METHOCARBAMOL 1000 MG/10ML IJ SOLN
500.0000 mg | Freq: Four times a day (QID) | INTRAMUSCULAR | Status: DC | PRN
  Administered 2014-02-08: 500 mg via INTRAVENOUS
  Filled 2014-02-08: qty 5

## 2014-02-08 MED ORDER — PROPOFOL 10 MG/ML IV BOLUS
INTRAVENOUS | Status: AC
  Filled 2014-02-08: qty 20

## 2014-02-08 MED ORDER — LACTATED RINGERS IV SOLN
INTRAVENOUS | Status: DC
  Administered 2014-02-08 (×3): via INTRAVENOUS

## 2014-02-08 MED ORDER — ONDANSETRON HCL 4 MG/2ML IJ SOLN: 4.0000 mg | Freq: Four times a day (QID) | INTRAMUSCULAR | Status: DC | PRN

## 2014-02-08 MED ORDER — OXYCODONE HCL 5 MG PO TABS
5.0000 mg | ORAL_TABLET | ORAL | Status: DC | PRN
  Administered 2014-02-08 – 2014-02-10 (×8): 10 mg via ORAL
  Filled 2014-02-08 (×8): qty 2

## 2014-02-08 MED ORDER — ACETAMINOPHEN 325 MG PO TABS: 650.0000 mg | ORAL_TABLET | Freq: Four times a day (QID) | ORAL | Status: DC | PRN

## 2014-02-08 MED ORDER — DEXAMETHASONE SODIUM PHOSPHATE 10 MG/ML IJ SOLN
INTRAMUSCULAR | Status: AC
Start: 2014-02-08 — End: 2014-02-08
  Filled 2014-02-08: qty 1

## 2014-02-08 MED ORDER — PROPOFOL INFUSION 10 MG/ML OPTIME
INTRAVENOUS | Status: DC | PRN
  Administered 2014-02-08: 50 ug/kg/min via INTRAVENOUS

## 2014-02-08 MED ORDER — SODIUM CHLORIDE 0.9 % IR SOLN
Status: DC | PRN
  Administered 2014-02-08: 1000 mL

## 2014-02-08 MED ORDER — METOCLOPRAMIDE HCL 5 MG/ML IJ SOLN: 5.0000 mg | Freq: Three times a day (TID) | INTRAMUSCULAR | Status: DC | PRN

## 2014-02-08 MED ORDER — MIDAZOLAM HCL 2 MG/2ML IJ SOLN
INTRAMUSCULAR | Status: AC
  Filled 2014-02-08: qty 2

## 2014-02-08 MED ORDER — BUPIVACAINE IN DEXTROSE 0.75-8.25 % IT SOLN
INTRATHECAL | Status: DC | PRN
  Administered 2014-02-08: 2 mL via INTRATHECAL

## 2014-02-08 MED ORDER — DEXAMETHASONE SODIUM PHOSPHATE 10 MG/ML IJ SOLN
10.0000 mg | Freq: Every day | INTRAMUSCULAR | Status: AC
  Filled 2014-02-08: qty 1

## 2014-02-08 MED ORDER — BUPIVACAINE LIPOSOME 1.3 % IJ SUSP
20.0000 mL | Freq: Once | INTRAMUSCULAR | Status: AC
  Administered 2014-02-08: 20 mL
  Filled 2014-02-08: qty 20

## 2014-02-08 MED ORDER — ONDANSETRON HCL 4 MG/2ML IJ SOLN
INTRAMUSCULAR | Status: AC
  Filled 2014-02-08: qty 2

## 2014-02-08 MED ORDER — DEXAMETHASONE SODIUM PHOSPHATE 10 MG/ML IJ SOLN
10.0000 mg | Freq: Once | INTRAMUSCULAR | Status: AC
  Administered 2014-02-08: 10 mg via INTRAVENOUS

## 2014-02-08 MED ORDER — FENTANYL CITRATE 0.05 MG/ML IJ SOLN
INTRAMUSCULAR | Status: AC
  Filled 2014-02-08: qty 2

## 2014-02-08 MED ORDER — SODIUM CHLORIDE 0.9 % IJ SOLN
INTRAMUSCULAR | Status: AC
  Filled 2014-02-08: qty 50

## 2014-02-08 MED ORDER — HYDROMORPHONE HCL PF 1 MG/ML IJ SOLN
0.2500 mg | INTRAMUSCULAR | Status: DC | PRN
  Administered 2014-02-08: 0.5 mg via INTRAVENOUS

## 2014-02-08 MED ORDER — METHOCARBAMOL 500 MG PO TABS
500.0000 mg | ORAL_TABLET | Freq: Four times a day (QID) | ORAL | Status: DC | PRN
  Administered 2014-02-09 – 2014-02-10 (×2): 500 mg via ORAL
  Filled 2014-02-08 (×2): qty 1

## 2014-02-08 MED ORDER — POLYETHYLENE GLYCOL 3350 17 G PO PACK: 17.0000 g | PACK | Freq: Every day | ORAL | Status: DC | PRN

## 2014-02-08 MED ORDER — CHLORHEXIDINE GLUCONATE 4 % EX LIQD: 60.0000 mL | Freq: Once | CUTANEOUS | Status: DC

## 2014-02-08 MED ORDER — KETOROLAC TROMETHAMINE 15 MG/ML IJ SOLN
7.5000 mg | Freq: Four times a day (QID) | INTRAMUSCULAR | Status: AC | PRN
  Administered 2014-02-08 – 2014-02-09 (×2): 7.5 mg via INTRAVENOUS
  Filled 2014-02-08 (×2): qty 1

## 2014-02-08 MED ORDER — BISACODYL 10 MG RE SUPP: 10.0000 mg | Freq: Every day | RECTAL | Status: DC | PRN

## 2014-02-08 MED ORDER — PROPOFOL 10 MG/ML IV BOLUS
INTRAVENOUS | Status: DC | PRN
  Administered 2014-02-08: 20 mg via INTRAVENOUS

## 2014-02-08 MED ORDER — MIDAZOLAM HCL 5 MG/5ML IJ SOLN
INTRAMUSCULAR | Status: DC | PRN
  Administered 2014-02-08: 2 mg via INTRAVENOUS

## 2014-02-08 MED ORDER — FLEET ENEMA 7-19 GM/118ML RE ENEM: 1.0000 | ENEMA | Freq: Once | RECTAL | Status: AC | PRN

## 2014-02-08 MED ORDER — BUPIVACAINE HCL (PF) 0.25 % IJ SOLN
INTRAMUSCULAR | Status: AC
  Filled 2014-02-08: qty 30

## 2014-02-08 MED ORDER — PHENOL 1.4 % MT LIQD: 1.0000 | OROMUCOSAL | Status: DC | PRN

## 2014-02-08 MED ORDER — CEFAZOLIN SODIUM-DEXTROSE 2-3 GM-% IV SOLR
INTRAVENOUS | Status: AC
  Filled 2014-02-08: qty 50

## 2014-02-08 MED ORDER — RIVAROXABAN 10 MG PO TABS
10.0000 mg | ORAL_TABLET | Freq: Every day | ORAL | Status: DC
  Administered 2014-02-09 – 2014-02-10 (×2): 10 mg via ORAL
  Filled 2014-02-08 (×3): qty 1

## 2014-02-08 MED ORDER — SODIUM CHLORIDE 0.9 % IV SOLN: INTRAVENOUS | Status: DC

## 2014-02-08 MED ORDER — BUPIVACAINE HCL 0.25 % IJ SOLN
INTRAMUSCULAR | Status: DC | PRN
  Administered 2014-02-08: 20 mL

## 2014-02-08 MED ORDER — TRAMADOL HCL 50 MG PO TABS: 50.0000 mg | ORAL_TABLET | Freq: Four times a day (QID) | ORAL | Status: DC | PRN

## 2014-02-08 MED ORDER — ACETAMINOPHEN 500 MG PO TABS
1000.0000 mg | ORAL_TABLET | Freq: Four times a day (QID) | ORAL | Status: AC
  Administered 2014-02-08 – 2014-02-09 (×4): 1000 mg via ORAL
  Filled 2014-02-08 (×4): qty 2

## 2014-02-08 MED ORDER — DEXAMETHASONE 6 MG PO TABS
10.0000 mg | ORAL_TABLET | Freq: Every day | ORAL | Status: AC
  Administered 2014-02-09: 10 mg via ORAL
  Filled 2014-02-08: qty 1

## 2014-02-08 MED ORDER — DIPHENHYDRAMINE HCL 12.5 MG/5ML PO ELIX: 12.5000 mg | ORAL_SOLUTION | ORAL | Status: DC | PRN

## 2014-02-08 MED ORDER — SODIUM CHLORIDE 0.9 % IJ SOLN
INTRAMUSCULAR | Status: DC | PRN
  Administered 2014-02-08: 30 mL

## 2014-02-08 MED ORDER — HYDROMORPHONE HCL PF 1 MG/ML IJ SOLN
INTRAMUSCULAR | Status: AC
  Filled 2014-02-08: qty 1

## 2014-02-08 MED ORDER — CEFAZOLIN SODIUM-DEXTROSE 2-3 GM-% IV SOLR
2.0000 g | INTRAVENOUS | Status: AC
Start: 2014-02-08 — End: 2014-02-08
  Administered 2014-02-08: 2 g via INTRAVENOUS

## 2014-02-08 MED ORDER — ONDANSETRON HCL 4 MG PO TABS: 4.0000 mg | ORAL_TABLET | Freq: Four times a day (QID) | ORAL | Status: DC | PRN

## 2014-02-08 SURGICAL SUPPLY — 58 items
BAG ZIPLOCK 12X15 (MISCELLANEOUS) ×2 IMPLANT
BANDAGE ELASTIC 6 VELCRO ST LF (GAUZE/BANDAGES/DRESSINGS) ×2 IMPLANT
BANDAGE ESMARK 6X9 LF (GAUZE/BANDAGES/DRESSINGS) ×1 IMPLANT
BLADE SAG 18X100X1.27 (BLADE) ×2 IMPLANT
BLADE SAW SGTL 11.0X1.19X90.0M (BLADE) ×2 IMPLANT
BNDG ESMARK 6X9 LF (GAUZE/BANDAGES/DRESSINGS) ×2
BOWL SMART MIX CTS (DISPOSABLE) ×2 IMPLANT
CAP KNEE ATTUNE RP ×2 IMPLANT
CEMENT HV SMART SET (Cement) ×4 IMPLANT
CUFF TOURN SGL QUICK 34 (TOURNIQUET CUFF) ×1
CUFF TRNQT CYL 34X4X40X1 (TOURNIQUET CUFF) ×1 IMPLANT
DECANTER SPIKE VIAL GLASS SM (MISCELLANEOUS) ×2 IMPLANT
DRAPE EXTREMITY T 121X128X90 (DRAPE) ×2 IMPLANT
DRAPE POUCH INSTRU U-SHP 10X18 (DRAPES) ×2 IMPLANT
DRAPE U-SHAPE 47X51 STRL (DRAPES) ×2 IMPLANT
DRSG ADAPTIC 3X8 NADH LF (GAUZE/BANDAGES/DRESSINGS) ×2 IMPLANT
DRSG PAD ABDOMINAL 8X10 ST (GAUZE/BANDAGES/DRESSINGS) IMPLANT
DURAPREP 26ML APPLICATOR (WOUND CARE) ×2 IMPLANT
ELECT REM PT RETURN 9FT ADLT (ELECTROSURGICAL) ×2
ELECTRODE REM PT RTRN 9FT ADLT (ELECTROSURGICAL) ×1 IMPLANT
EVACUATOR 1/8 PVC DRAIN (DRAIN) ×2 IMPLANT
FACESHIELD WRAPAROUND (MASK) ×10 IMPLANT
GAUZE SPONGE 4X4 12PLY STRL (GAUZE/BANDAGES/DRESSINGS) ×2 IMPLANT
GLOVE BIO SURGEON STRL SZ7.5 (GLOVE) IMPLANT
GLOVE BIO SURGEON STRL SZ8 (GLOVE) ×2 IMPLANT
GLOVE BIOGEL PI IND STRL 6.5 (GLOVE) IMPLANT
GLOVE BIOGEL PI IND STRL 8 (GLOVE) ×1 IMPLANT
GLOVE BIOGEL PI INDICATOR 6.5 (GLOVE)
GLOVE BIOGEL PI INDICATOR 8 (GLOVE) ×1
GLOVE SURG SS PI 6.5 STRL IVOR (GLOVE) IMPLANT
GOWN STRL REUS W/TWL LRG LVL3 (GOWN DISPOSABLE) ×2 IMPLANT
GOWN STRL REUS W/TWL XL LVL3 (GOWN DISPOSABLE) IMPLANT
HANDPIECE INTERPULSE COAX TIP (DISPOSABLE) ×1
IMMOBILIZER KNEE 20 (SOFTGOODS) ×2
IMMOBILIZER KNEE 20 THIGH 36 (SOFTGOODS) ×1 IMPLANT
KIT BASIN OR (CUSTOM PROCEDURE TRAY) ×2 IMPLANT
MANIFOLD NEPTUNE II (INSTRUMENTS) ×2 IMPLANT
NDL SAFETY ECLIPSE 18X1.5 (NEEDLE) ×2 IMPLANT
NEEDLE HYPO 18GX1.5 SHARP (NEEDLE) ×2
NS IRRIG 1000ML POUR BTL (IV SOLUTION) ×2 IMPLANT
PACK TOTAL JOINT (CUSTOM PROCEDURE TRAY) ×2 IMPLANT
PAD ABD 8X10 STRL (GAUZE/BANDAGES/DRESSINGS) ×2 IMPLANT
PADDING CAST COTTON 6X4 STRL (CAST SUPPLIES) ×2 IMPLANT
POSITIONER SURGICAL ARM (MISCELLANEOUS) ×2 IMPLANT
SET HNDPC FAN SPRY TIP SCT (DISPOSABLE) ×1 IMPLANT
STRIP CLOSURE SKIN 1/2X4 (GAUZE/BANDAGES/DRESSINGS) ×2 IMPLANT
SUCTION FRAZIER 12FR DISP (SUCTIONS) ×2 IMPLANT
SUT MNCRL AB 4-0 PS2 18 (SUTURE) ×2 IMPLANT
SUT VIC AB 2-0 CT1 27 (SUTURE) ×3
SUT VIC AB 2-0 CT1 TAPERPNT 27 (SUTURE) ×3 IMPLANT
SUT VLOC 180 0 24IN GS25 (SUTURE) ×2 IMPLANT
SYRINGE 20CC LL (MISCELLANEOUS) ×2 IMPLANT
SYRINGE 60CC LL (MISCELLANEOUS) ×2 IMPLANT
TOWEL OR 17X26 10 PK STRL BLUE (TOWEL DISPOSABLE) ×2 IMPLANT
TOWEL OR NON WOVEN STRL DISP B (DISPOSABLE) IMPLANT
TRAY FOLEY CATH 14FRSI W/METER (CATHETERS) ×2 IMPLANT
WATER STERILE IRR 1500ML POUR (IV SOLUTION) ×2 IMPLANT
WRAP KNEE MAXI GEL POST OP (GAUZE/BANDAGES/DRESSINGS) IMPLANT

## 2014-02-08 NOTE — Anesthesia Procedure Notes (Signed)
Spinal  Patient location during procedure: OR Start time: 02/08/2014 12:49 PM End time: 02/08/2014 12:53 PM Staffing Anesthesiologist: Salley Scarlet CRNA/Resident: Darlys Gales R Performed by: anesthesiologist and resident/CRNA  Preanesthetic Checklist Completed: patient identified, site marked, surgical consent, pre-op evaluation, timeout performed, IV checked, risks and benefits discussed and monitors and equipment checked Spinal Block Patient position: sitting Prep: Betadine Patient monitoring: heart rate, continuous pulse ox, cardiac monitor and blood pressure Approach: midline Location: L3-4 Injection technique: single-shot Needle Needle type: Sprotte  Needle gauge: 24 G Needle length: 9 cm Needle insertion depth: 7.5 cm Assessment Sensory level: T4 Additional Notes Performed by Darlys Gales CRNA under supervision of Dr. Delma Post.

## 2014-02-08 NOTE — Transfer of Care (Signed)
Immediate Anesthesia Transfer of Care Note  Patient: Denise Rogers  Procedure(s) Performed: Procedure(s) (LRB): LEFT TOTAL KNEE ARTHROPLASTY (Left)  Patient Location: PACU  Anesthesia Type: Spinal  Level of Consciousness: sedated, patient cooperative and responds to stimulation  Airway & Oxygen Therapy: Patient Spontanous Breathing and Patient connected to face mask oxgen  Post-op Assessment: Report given to PACU RN and Post -op Vital signs reviewed and stable  Post vital signs: Reviewed and stable  Complications: No apparent anesthesia complications

## 2014-02-08 NOTE — Anesthesia Postprocedure Evaluation (Signed)
  Anesthesia Post-op Note  Patient: Denise Rogers  Procedure(s) Performed: Procedure(s) (LRB): LEFT TOTAL KNEE ARTHROPLASTY (Left)  Patient Location: PACU  Anesthesia Type: Spinal  Level of Consciousness: awake and alert   Airway and Oxygen Therapy: Patient Spontanous Breathing  Post-op Pain: mild  Post-op Assessment: Post-op Vital signs reviewed, Patient's Cardiovascular Status Stable, Respiratory Function Stable, Patent Airway and No signs of Nausea or vomiting  Last Vitals:  Filed Vitals:   02/08/14 1829  BP: 126/69  Pulse: 65  Temp: 36.5 C  Resp: 16    Post-op Vital Signs: stable   Complications: No apparent anesthesia complications

## 2014-02-08 NOTE — Interval H&P Note (Signed)
History and Physical Interval Note:  02/08/2014 11:46 AM  Denise Rogers  has presented today for surgery, with the diagnosis of OA LEFT KNEE   The various methods of treatment have been discussed with the patient and family. After consideration of risks, benefits and other options for treatment, the patient has consented to  Procedure(s): LEFT TOTAL KNEE ARTHROPLASTY (Left) as a surgical intervention .  The patient's history has been reviewed, patient examined, no change in status, stable for surgery.  I have reviewed the patient's chart and labs.  Questions were answered to the patient's satisfaction.     Gearlean Alf

## 2014-02-08 NOTE — Op Note (Signed)
Pre-operative diagnosis- Osteoarthritis  Left knee(s)  Post-operative diagnosis- Osteoarthritis Left knee(s)  Procedure-  Left  Total Knee Arthroplasty  Surgeon- Dione Plover. Chaniqua Brisby, MD  Assistant- Arlee Muslim, PA-C   Anesthesia-  Spinal  EBL-* No blood loss amount entered *   Drains Hemovac  Tourniquet time-  Total Tourniquet Time Documented: Thigh (Left) - 39 minutes Total: Thigh (Left) - 39 minutes     Complications- None  Condition-PACU - hemodynamically stable.   Brief Clinical Note  Denise Rogers is a 48 y.o. year old female with end stage OA of her left knee with progressively worsening pain and dysfunction. She has constant pain, with activity and at rest and significant functional deficits with difficulties even with ADLs. She has had extensive non-op management including analgesics, injections of cortisone and viscosupplements, and home exercise program, but remains in significant pain with significant dysfunction. Radiographs show bone on bone arthritis medial and patellofemoral. She presents now for left Total Knee Arthroplasty.    Procedure in detail---   The patient is brought into the operating room and positioned supine on the operating table. After successful administration of  Spinal,   a tourniquet is placed high on the  Left thigh(s) and the lower extremity is prepped and draped in the usual sterile fashion. Time out is performed by the operating team and then the  Left lower extremity is wrapped in Esmarch, knee flexed and the tourniquet inflated to 300 mmHg.       A midline incision is made with a ten blade through the subcutaneous tissue to the level of the extensor mechanism. A fresh blade is used to make a medial parapatellar arthrotomy. Soft tissue over the proximal medial tibia is subperiosteally elevated to the joint line with a knife and into the semimembranosus bursa with a Cobb elevator. Soft tissue over the proximal lateral tibia is elevated with  attention being paid to avoiding the patellar tendon on the tibial tubercle. The patella is everted, knee flexed 90 degrees and the ACL and PCL are removed. Findings are bone on bone medial and patellofemoral with large osteophytes.        The drill is used to create a starting hole in the distal femur and the canal is thoroughly irrigated with sterile saline to remove the fatty contents. The 5 degree Left  valgus alignment guide is placed into the femoral canal and the distal femoral cutting block is pinned to remove 9 mm off the distal femur. Resection is made with an oscillating saw.      The tibia is subluxed forward and the menisci are removed. The extramedullary alignment guide is placed referencing proximally at the medial aspect of the tibial tubercle and distally along the second metatarsal axis and tibial crest. The block is pinned to remove 54mm off the more deficient medial  side. Resection is made with an oscillating saw. Size 4is the most appropriate size for the tibia and the proximal tibia is prepared with the modular drill and keel punch for that size.      The femoral sizing guide is placed and size 5 is most appropriate. Rotation is marked off the epicondylar axis and confirmed by creating a rectangular flexion gap at 90 degrees. The size 5 cutting block is pinned in this rotation and the anterior, posterior and chamfer cuts are made with the oscillating saw. The intercondylar block is then placed and that cut is made.      Trial size 4 tibial component, trial  size 5 posterior stabilized femur and a 6  mm posterior stabilized rotating platform insert trial is placed. Full extension is achieved with excellent varus/valgus and anterior/posterior balance throughout full range of motion. The patella is everted and thickness measured to be 22  mm. Free hand resection is taken to 12 mm, a 35 template is placed, lug holes are drilled, trial patella is placed, and it tracks normally. Osteophytes are  removed off the posterior femur with the trial in place. All trials are removed and the cut bone surfaces prepared with pulsatile lavage. Cement is mixed and once ready for implantation, the size 4 tibial implant, size  5 narrow posterior stabilized femoral component, and the size 35 patella are cemented in place and the patella is held with the clamp. The trial insert is placed and the knee held in full extension. The Exparel (20 ml mixed with 30 ml saline) and .25% Bupivicaine, are injected into the extensor mechanism, posterior capsule, medial and lateral gutters and subcutaneous tissues.  All extruded cement is removed and once the cement is hard the permanent 6 mm posterior stabilized rotating platform insert is placed into the tibial tray.      The wound is copiously irrigated with saline solution and the extensor mechanism closed over a hemovac drain with #1 V-loc suture. The tourniquet is released for a total tourniquet time of 39  minutes. Flexion against gravity is 140 degrees and the patella tracks normally. Subcutaneous tissue is closed with 2.0 vicryl and subcuticular with running 4.0 Monocryl. The incision is cleaned and dried and steri-strips and a bulky sterile dressing are applied. The limb is placed into a knee immobilizer and the patient is awakened and transported to recovery in stable condition.      Please note that a surgical assistant was a medical necessity for this procedure in order to perform it in a safe and expeditious manner. Surgical assistant was necessary to retract the ligaments and vital neurovascular structures to prevent injury to them and also necessary for proper positioning of the limb to allow for anatomic placement of the prosthesis.   Dione Plover [name], MD    02/08/2014, 2:04 PM

## 2014-02-08 NOTE — H&P (View-Only) (Signed)
Denise Rogers DOB: 05-18-1966 Single / Language: Denise Rogers / Race: Black or African American, White Female Date of Admission:  02/08/2014 Chief Complaint: Left Knee Pain History of Present Illness The patient is a 48 year old female who comes in for a preoperative history and physical. The patient is scheduled for a left total knee arthroplasty to be performed by Dr. Dione Plover. Aluisio, MD at [facility] on 02/08/2014. The patient is a 48 year old female who presents with knee complaints. The patient was seen for a second opinion. The patient reports left knee and right knee symptoms including: pain, stiffness, soreness and grinding which began year(s) ago. The patient describes the severity of the symptoms as moderate in severity.The patient feels that the symptoms are worsening. The patient has the current diagnosis of knee osteoarthritis. Prior to being seen today the patient was previously evaluated by a colleague. Previous work-up for this problem has included knee x-rays. Past treatment for this problem has included knee brace, intra-articular injection of corticosteroids (She also finished a series of Supartz in both knees 11 weeks ago) and nonsteroidal anti-inflammatory drugs (Relafen, as well as Celebrex). She states that the left knee bothers her at all times. She has pain in the right also, but the left one is worse. It limits what she can and cannot do. She said the cortisone and Supartz injections have not provided any benefit. She is at a stage now where the knee has effectively taken over her life. She works at [employer] on her feet throughout most of the day. It is getting harder to do her work also because of the pain. They have been treated conservatively in the past for the above stated problem and despite conservative measures, they continue to have progressive pain and severe functional limitations and dysfunction. They have failed non-operative management including home  exercise, medications, and injections. It is felt that they would benefit from undergoing total joint replacement. Risks and benefits of the procedure have been discussed with the patient and they elect to proceed with surgery. There are no active contraindications to surgery such as ongoing infection or rapidly progressive neurological disease.  Problem List Primary osteoarthritis of both knees (715.16  M17.0)  Allergies No Known Drug Allergies  Family History  Rheumatoid Arthritis Mother. Osteoporosis Mother. Osteoarthritis Mother. Osteoarthritis Mother. Anemia Mother. Congestive Heart Failure Mother. Heart Disease Mother. First Degree Relatives reported Hypertension Mother. Heart disease in female family member before age 102 Diabetes Mellitus Mother. Congestive Heart Failure Mother.  Social History  Current work status working full time Children 0 Living situation live alone Exercise Exercises rarely; does running / walking Marital status single Tobacco use Never smoker. 09/24/2013 Tobacco / smoke exposure 09/24/2013: yes No history of drug/alcohol rehab Never consumed alcohol 09/24/2013: Never consumed alcohol Number of flights of stairs before winded 4-5 Not under pain contract Not Previously addicted to/Dependent on drugs or pain medications No History Of Illicit drug use No alcohol use  Medication History One-A-Day Womens (Oral) Active. Lisinopril-Hydrochlorothiazide (10-12.5MG  Tablet, Oral) Active. Aspirin EC (81MG  Tablet DR, Oral) Active. Omega 3 (Oral) Specific dose unknown - Active.   Past Surgical History  Hysterectomy Date: 05/07/2011. complete (non-cancerous) Carpal Tunnel Repair Date: 06/30/2012. right  Past Medical History Anemia Iron deficiency Hypertension  Review of Systems General Not Present- Chills, Fatigue, Fever, Memory Loss, Night Sweats, Weight Gain and Weight Loss. Skin Not Present- Eczema, Hives,  Itching, Lesions and Rash. HEENT Not Present- Dentures, Double Vision, Headache, Hearing Loss,  Tinnitus and Visual Loss. Respiratory Not Present- Allergies, Chronic Cough, Coughing up blood, Shortness of breath at rest and Shortness of breath with exertion. Cardiovascular Not Present- Chest Pain, Difficulty Breathing Lying Down, Murmur, Palpitations, Racing/skipping heartbeats and Swelling. Gastrointestinal Not Present- Abdominal Pain, Bloody Stool, Constipation, Diarrhea, Difficulty Swallowing, Heartburn, Jaundice, Loss of appetitie, Nausea and Vomiting. Female Genitourinary Not Present- Blood in Urine, Discharge, Flank Pain, Incontinence, Painful Urination, Urgency, Urinary frequency, Urinary Retention, Urinating at Night and Weak urinary stream. Musculoskeletal Present- Back Pain and Joint Pain. Not Present- Joint Swelling, Morning Stiffness, Muscle Pain, Muscle Weakness and Spasms. Neurological Not Present- Blackout spells, Difficulty with balance, Dizziness, Paralysis, Tremor and Weakness. Psychiatric Not Present- Insomnia.   Vitals  Pulse: 68 (Regular)  Resp.: 14 (Unlabored)  BP: 122/72 (Sitting, Right Arm, Standard)    Physical Exam  General Mental Status -Alert, cooperative and good historian. General Appearance-pleasant, Not in acute distress. Orientation-Oriented X3. Build & Nutrition-Well nourished and Well developed.  Head and Neck Head-normocephalic, atraumatic . Neck Global Assessment - supple, no bruit auscultated on the right, no bruit auscultated on the left.  Eye Vision-Wears corrective lenses. Pupil - Bilateral-Regular and Round. Motion - Bilateral-EOMI.  Chest and Lung Exam Auscultation Breath sounds - clear at anterior chest wall and clear at posterior chest wall. Adventitious sounds - No Adventitious sounds.  Cardiovascular Auscultation Rhythm - Regular rate and rhythm. Heart Sounds - S1 WNL and S2 WNL. Murmurs & Other Heart Sounds -  Auscultation of the heart reveals - No Murmurs.  Abdomen Palpation/Percussion Tenderness - Abdomen is non-tender to palpation. Rigidity (guarding) - Abdomen is soft. Auscultation Auscultation of the abdomen reveals - Bowel sounds normal.  Female Genitourinary Note: Not done, not pertinent to present illness   Musculoskeletal Note: Musculoskeletal: Evaluation of her hips show normal range of motion with no discomfort.  Extremities: Both knees show no effusion. Left knee range 5 to 125. Marked crepitus on range of motion. Tenderness medial greater than lateral with no instability. Right knee: No effusion, Range 5 to 130. No lateral tenderness. There is some medial tenderness. There is moderate crepitus on range of motion. There is no instability noted. Pulse, sensation, and motor intact both lower extremities. She has an antalgic gait pattern on the left.  X-RAYS: Radiographs AP both knees and lateral show that on the left knee, she has essentially bone on the one medial margin of the medial compartment, and it has bone on bone patellofemoral joint. The right knee has medial narrowing, but not bone on bone.  Assessment & Plan Primary osteoarthritis of both knees (715.16  M17.0) Note:Plan is for a Left Total Knee Replacement by Dr. Wynelle Link.  Plan is to go home versus SNF depending upon her progess.  PCP - Dr. Jonathon Jordan - Patient has been seen preoperatively and felt to be stable for surgery.  The patient does not have any contraindications and will receive TXA (tranexamic acid) prior to surgery.  Signed electronically by Joelene Millin, III PA-C

## 2014-02-08 NOTE — Anesthesia Preprocedure Evaluation (Addendum)
Anesthesia Evaluation  Patient identified by MRN, date of birth, ID band Patient awake    Reviewed: Allergy & Precautions, H&P , NPO status , Patient's Chart, lab work & pertinent test results  Airway Mallampati: II TM Distance: >3 FB Neck ROM: Full    Dental no notable dental hx.    Pulmonary neg pulmonary ROS,  breath sounds clear to auscultation  Pulmonary exam normal       Cardiovascular hypertension, Pt. on medications Rhythm:Regular Rate:Normal     Neuro/Psych negative neurological ROS  negative psych ROS   GI/Hepatic negative GI ROS, Neg liver ROS,   Endo/Other  negative endocrine ROS  Renal/GU negative Renal ROS  negative genitourinary   Musculoskeletal negative musculoskeletal ROS (+)   Abdominal (+) + obese,   Peds negative pediatric ROS (+)  Hematology  (+) anemia ,   Anesthesia Other Findings   Reproductive/Obstetrics negative OB ROS                          Anesthesia Physical Anesthesia Plan  ASA: II  Anesthesia Plan: Spinal   Post-op Pain Management:    Induction: Intravenous  Airway Management Planned:   Additional Equipment:   Intra-op Plan:   Post-operative Plan:   Informed Consent: I have reviewed the patients History and Physical, chart, labs and discussed the procedure including the risks, benefits and alternatives for the proposed anesthesia with the patient or authorized representative who has indicated his/her understanding and acceptance.   Dental advisory given  Plan Discussed with: CRNA  Anesthesia Plan Comments: (Discussed risks/benefits of spinal including headache, backache, failure, bleeding, infection, and nerve damage. Patient consents to spinal. Questions answered. Coagulation studies and platelet count acceptable.)       Anesthesia Quick Evaluation

## 2014-02-09 ENCOUNTER — Encounter (HOSPITAL_COMMUNITY): Payer: Self-pay | Admitting: Orthopedic Surgery

## 2014-02-09 DIAGNOSIS — E871 Hypo-osmolality and hyponatremia: Secondary | ICD-10-CM | POA: Diagnosis not present

## 2014-02-09 LAB — CBC
HEMATOCRIT: 34 % — AB (ref 36.0–46.0)
Hemoglobin: 10.9 g/dL — ABNORMAL LOW (ref 12.0–15.0)
MCH: 26.8 pg (ref 26.0–34.0)
MCHC: 32.1 g/dL (ref 30.0–36.0)
MCV: 83.7 fL (ref 78.0–100.0)
Platelets: 366 10*3/uL (ref 150–400)
RBC: 4.06 MIL/uL (ref 3.87–5.11)
RDW: 14.7 % (ref 11.5–15.5)
WBC: 13.1 10*3/uL — ABNORMAL HIGH (ref 4.0–10.5)

## 2014-02-09 LAB — BASIC METABOLIC PANEL
Anion gap: 11 (ref 5–15)
BUN: 13 mg/dL (ref 6–23)
CO2: 23 mEq/L (ref 19–32)
CREATININE: 0.75 mg/dL (ref 0.50–1.10)
Calcium: 8.5 mg/dL (ref 8.4–10.5)
Chloride: 102 mEq/L (ref 96–112)
GFR calc non Af Amer: >90 mL/min (ref >90)
Glucose, Bld: 151 mg/dL — ABNORMAL HIGH (ref 70–99)
POTASSIUM: 4.2 meq/L (ref 3.7–5.3)
Sodium: 136 mEq/L — ABNORMAL LOW (ref 137–147)

## 2014-02-09 NOTE — Evaluation (Signed)
Occupational Therapy Evaluation Patient Details Name: Denise Rogers MRN: [id number] DOB: Jan 22, 1966 Today's Date: 02/09/2014    History of Present Illness s/p L TKA   Clinical Impression   This 48 year old female was admitted for the above surgery.  All education was completed.  No further OT is needed at this time.      Follow Up Recommendations  No OT follow up    Equipment Recommendations  3 in 1 bedside comode    Recommendations for Other Services       Precautions / Restrictions Precautions Precautions: Fall;Knee Required Braces or Orthoses: Knee Immobilizer - Left Knee Immobilizer - Left: Discontinue once straight leg raise with < 10 degree lag Restrictions Other Position/Activity Restrictions: WBAT      Mobility Bed Mobility                  Transfers Overall transfer level: Needs assistance Equipment used: Rolling walker (2 wheeled) Transfers: Sit to/from Stand Sit to Stand: Min assist         General transfer comment: pt self-cued for hand placement:  cues for LLE placement    Balance                                            ADL Overall ADL's : Needs assistance/impaired     Grooming: Wash/dry hands;Supervision/safety;Standing   Upper Body Bathing: Set up;Sitting   Lower Body Bathing: Minimal assistance;Sit to/from stand   Upper Body Dressing : Set up;Sitting   Lower Body Dressing: Minimal assistance;Sit to/from stand   Toilet Transfer: Minimal assistance;Ambulation;BSC   Toileting- Water quality scientist and Hygiene: Min guard;Sit to/from stand         General ADL Comments: Pt ambulated to bathroom to use commode.  Educated/demonstrated use of tub bench, which she is interested in.  Gave her resource sheet as she feels she will have family pick up for her.  Reviewed adl sequence:  she has help of mother and sister as needed     Vision                     Perception     Praxis       Pertinent Vitals/Pain Pain Assessment: 0-10 Pain Score: 3  Pain Location: L knee Pain Descriptors / Indicators: Sore Pain Intervention(s): Limited activity within patient's tolerance;Repositioned;Ice applied     Hand Dominance     Extremity/Trunk Assessment Upper Extremity Assessment Upper Extremity Assessment: Overall WFL for tasks assessed   Lower Extremity Assessment Lower Extremity Assessment: LLE deficits/detail       Communication Communication Communication: No difficulties   Cognition Arousal/Alertness: Awake/alert Behavior During Therapy: WFL for tasks assessed/performed Overall Cognitive Status: Within Functional Limits for tasks assessed                     General Comments       Exercises       Shoulder Instructions      Home Living Family/patient expects to be discharged to:: Private residence Living Arrangements: Alone Available Help at Discharge: Family Type of Home: House Home Access: Ramped entrance     Home Layout: One level;Two level Alternate Level Stairs-Number of Steps: 3   Bathroom Shower/Tub: Tub/shower unit Shower/tub characteristics: Architectural technologist: Standard     Home Equipment: None  Prior Functioning/Environment Level of Independence: Independent             OT Diagnosis:     OT Problem List:     OT Treatment/Interventions:      OT Goals(Current goals can be found in the care plan section)    OT Frequency:     Barriers to D/C:            Co-evaluation              End of Session    Activity Tolerance: Patient tolerated treatment well Patient left: in chair;with call bell/phone within reach   Time: 0939-1002 OT Time Calculation (min): 23 min Charges:  OT General Charges $OT Visit: 1 Procedure OT Evaluation $Initial OT Evaluation Tier I: 1 Procedure OT Treatments $Self Care/Home Management : 8-22 mins G-Codes:    Maebelle Sulton 02-20-2014, 10:27 AM   Lesle Chris, OTR/L [phone] Feb 20, 2014

## 2014-02-09 NOTE — Progress Notes (Signed)
   Subjective: 1 Day Post-Op Procedure(s) (LRB): LEFT TOTAL KNEE ARTHROPLASTY (Left) Patient reports pain as mild.   Patient seen in rounds with Dr. Wynelle Link. Sitting up in bed eating breakfast Patient is well, and has had no acute complaints or problems We will start therapy today.  Plan is to go Home after hospital stay.  Objective: Vital signs in last 24 hours: Temp:  [97.4 F (36.3 C)-98.6 F (37 C)] 98.1 F (36.7 C) (08/25 0604) Pulse Rate:  [62-76] 76 (08/25 0604) Resp:  [14-18] 16 (08/25 0604) BP: (80-128)/(55-81) 100/55 mmHg (08/25 0604) SpO2:  [98 %-100 %] 99 % (08/25 0831) Weight:  [98.431 kg (217 lb)] 98.431 kg (217 lb) (08/24 0940)  Intake/Output from previous day:  Intake/Output Summary (Last 24 hours) at 02/09/14 0833 Last data filed at 02/09/14 8756  Gross per 24 hour  Intake   2915 ml  Output   1645 ml  Net   1270 ml    Intake/Output this shift: UOP 1100 since MN  Labs:  Recent Labs  02/09/14 0438  HGB 10.9*    Recent Labs  02/09/14 0438  WBC 13.1*  RBC 4.06  HCT 34.0*  PLT 366    Recent Labs  02/09/14 0438  NA 136*  K 4.2  CL 102  CO2 23  BUN 13  CREATININE 0.75  GLUCOSE 151*  CALCIUM 8.5   No results found for this basename: LABPT, INR,  in the last 72 hours  EXAM General - Patient is Alert, Appropriate and Oriented Extremity - Neurovascular intact Sensation intact distally Dorsiflexion/Plantar flexion intact Dressing - dressing C/D/I Motor Function - intact, moving foot and toes well on exam.  Hemovac pulled without difficulty.  Past Medical History  Diagnosis Date  . Hypertension   . Arthritis     shoulder; BOTH KNEES  . Anemia     IRON DEFICIENCY ANEMIA    Assessment/Plan: 1 Day Post-Op Procedure(s) (LRB): LEFT TOTAL KNEE ARTHROPLASTY (Left) Principal Problem:   OA (osteoarthritis) of knee Active Problems:   Hyponatremia  Estimated body mass index is 35.04 kg/(m^2) as calculated from the following:  Height as of this encounter: 5\' 6"  (1.676 m).   Weight as of this encounter: 98.431 kg (217 lb). Advance diet Up with therapy Home versus SNF depending upon progress.  DVT Prophylaxis - Xarelto Weight-Bearing as tolerated to left leg D/C O2 and Pulse OX and try on Room Air  Arlee Muslim, PA-C Orthopaedic Surgery 02/09/2014, 8:33 AM

## 2014-02-09 NOTE — Discharge Instructions (Addendum)
° °Dr. Frank Aluisio °Total Joint Specialist °[facility] °[address] °[place], [place] [address] °[phone] ° °TOTAL KNEE REPLACEMENT POSTOPERATIVE DIRECTIONS ° ° ° °Knee Rehabilitation, Guidelines Following Surgery  °Results after knee surgery are often greatly improved when you follow the exercise, range of motion and muscle strengthening exercises prescribed by your doctor. Safety measures are also important to protect the knee from further injury. Any time any of these exercises cause you to have increased pain or swelling in your knee joint, decrease the amount until you are comfortable again and slowly increase them. If you have problems or questions, call your caregiver or physical therapist for advice.  ° °HOME CARE INSTRUCTIONS  °Remove items at home which could result in a fall. This includes throw rugs or furniture in walking pathways.  °Continue medications as instructed at time of discharge. °You may have some home medications which will be placed on hold until you complete the course of blood thinner medication.  °You may start showering once you are discharged home but do not submerge the incision under water. Just pat the incision dry and apply a dry gauze dressing on daily. °Walk with walker as instructed.  °You may resume a sexual relationship in one month or when given the OK by  your doctor.  °· Use walker as long as suggested by your caregivers. °· Avoid periods of inactivity such as sitting longer than an hour when not asleep. This helps prevent blood clots.  °You may put full weight on your legs and walk as much as is comfortable.  °You may return to work once you are cleared by your doctor.  °Do not drive a car for 6 weeks or until released by you surgeon.  °· Do not drive while taking narcotics.  °Wear the elastic stockings for three weeks following surgery during the day but you may remove then at night. °Make sure you keep all of your appointments after your  operation with all of your doctors and caregivers. You should call the office at the above phone number and make an appointment for approximately two weeks after the date of your surgery. °Change the dressing daily and reapply a dry dressing each time. °Please pick up a stool softener and laxative for home use as long as you are requiring pain medications. °· Continue to use ice on the knee for pain and swelling from surgery. You may notice swelling that will progress down to the foot and ankle.  This is normal after surgery.  Elevate the leg when you are not up walking on it.   °It is important for you to complete the blood thinner medication as prescribed by your doctor. °· Continue to use the breathing machine which will help keep your temperature down.  It is common for your temperature to cycle up and down following surgery, especially at night when you are not up moving around and exerting yourself.  The breathing machine keeps your lungs expanded and your temperature down. ° °RANGE OF MOTION AND STRENGTHENING EXERCISES  °Rehabilitation of the knee is important following a knee injury or an operation. After just a few days of immobilization, the muscles of the thigh which control the knee become weakened and shrink (atrophy). Knee exercises are designed to build up the tone and strength of the thigh muscles and to improve knee motion. Often times heat used for twenty to thirty minutes before working out will loosen up your tissues and help with improving the   range of motion but do not use heat for the first two weeks following surgery. These exercises can be done on a training (exercise) mat, on the floor, on a table or on a bed. Use what ever works the best and is most comfortable for you Knee exercises include:  °Leg Lifts - While your knee is still immobilized in a splint or cast, you can do straight leg raises. Lift the leg to 60 degrees, hold for 3 sec, and slowly lower the leg. Repeat 10-20 times 2-3  times daily. Perform this exercise against resistance later as your knee gets better.  °Quad and Hamstring Sets - Tighten up the muscle on the front of the thigh (Quad) and hold for 5-10 sec. Repeat this 10-20 times hourly. Hamstring sets are done by pushing the foot backward against an object and holding for 5-10 sec. Repeat as with quad sets.  °A rehabilitation program following serious knee injuries can speed recovery and prevent re-injury in the future due to weakened muscles. Contact your doctor or a physical therapist for more information on knee rehabilitation.  ° °SKILLED REHAB INSTRUCTIONS: °If the patient is transferred to a skilled rehab facility following release from the hospital, a list of the current medications will be sent to the facility for the patient to continue.  When discharged from the skilled rehab facility, please have the facility set up the patient's Home Health Physical Therapy prior to being released. Also, the skilled facility will be responsible for providing the patient with their medications at time of release from the facility to include their pain medication, the muscle relaxants, and their blood thinner medication. If the patient is still at the rehab facility at time of the two week follow up appointment, the skilled rehab facility will also need to assist the patient in arranging follow up appointment in our office and any transportation needs. ° °MAKE SURE YOU:  °Understand these instructions.  °Will watch your condition.  °Will get help right away if you are not doing well or get worse.  ° ° °Pick up stool softner and laxative for home. °Do not submerge incision under water. °May shower. °Continue to use ice for pain and swelling from surgery. ° °Take Xarelto for two and a half more weeks, then discontinue Xarelto. °Once the patient has completed the Xarelto, they may resume the 81 mg Aspirin. ° ° °Information on my medicine - XARELTO® (Rivaroxaban) ° °This medication  education was reviewed with me or my healthcare representative as part of my discharge preparation.  The pharmacist that spoke with me during my hospital stay was:  Absher, Randall K, RPH ° °Why was Xarelto® prescribed for you? °Xarelto® was prescribed for you to reduce the risk of blood clots forming after orthopedic surgery. The medical term for these abnormal blood clots is venous thromboembolism (VTE). ° °What do you need to know about xarelto® ? °Take your Xarelto® ONCE DAILY at the same time every day. °You may take it either with or without food. ° °If you have difficulty swallowing the tablet whole, you may crush it and mix in applesauce just prior to taking your dose. ° °Take Xarelto® exactly as prescribed by your doctor and DO NOT stop taking Xarelto® without talking to the doctor who prescribed the medication.  Stopping without other VTE prevention medication to take the place of Xarelto® may increase your risk of developing a clot. ° °After discharge, you should have regular check-up appointments with your healthcare provider that is   prescribing your Xarelto®.   ° °What do you do if you miss a dose? °If you miss a dose, take it as soon as you remember on the same day then continue your regularly scheduled once daily regimen the next day. Do not take two doses of Xarelto® on the same day.  ° °Important Safety Information °A possible side effect of Xarelto® is bleeding. You should call your healthcare provider right away if you experience any of the following: °  Bleeding from an injury or your nose that does not stop. °  Unusual colored urine (red or dark brown) or unusual colored stools (red or black). °  Unusual bruising for unknown reasons. °  A serious fall or if you hit your head (even if there is no bleeding). ° °Some medicines may interact with Xarelto® and might increase your risk of bleeding while on Xarelto®. To help avoid this, consult your healthcare provider or pharmacist prior to using any  new prescription or non-prescription medications, including herbals, vitamins, non-steroidal anti-inflammatory drugs (NSAIDs) and supplements. ° °This website has more information on Xarelto®: www.xarelto.com. ° ° °

## 2014-02-09 NOTE — Evaluation (Signed)
Physical Therapy Evaluation Patient Details Name: Denise Rogers MRN: [id number] DOB: 1965-10-01 Today's Date: 02/09/2014   History of Present Illness  s/p L TKA  Clinical Impression  Pt will benefit from PT to address deficits below; recommend HHPT  And RW fo rhome    Follow Up Recommendations Home health PT    Equipment Recommendations  Rolling walker with 5" wheels    Recommendations for Other Services       Precautions / Restrictions Precautions Precautions: Fall;Knee Required Braces or Orthoses: Knee Immobilizer - Left Knee Immobilizer - Left: Discontinue once straight leg raise with < 10 degree lag Restrictions Other Position/Activity Restrictions: WBAT      Mobility  Bed Mobility Overal bed mobility: Needs Assistance Bed Mobility: Supine to Sit     Supine to sit: Min assist     General bed mobility comments: min with RLE  Transfers Overall transfer level: Needs assistance Equipment used: Rolling walker (2 wheeled) Transfers: Sit to/from Stand Sit to Stand: Min assist         General transfer comment: cues for hand placement and LLE position  Ambulation/Gait Ambulation/Gait assistance: Min assist;Min guard Ambulation Distance (Feet): 60 Feet Assistive device: Rolling walker (2 wheeled) Gait Pattern/deviations: Step-to pattern;Antalgic     General Gait Details: cues for RW position form self, step length, upward gaze  Stairs            Wheelchair Mobility    Modified Rankin (Stroke Patients Only)       Balance                                             Pertinent Vitals/Pain Pain Assessment: 0-10 Pain Score: 3  Pain Location: L knee Pain Descriptors / Indicators: Sore Pain Intervention(s): Limited activity within patient's tolerance;Repositioned;Ice applied    Home Living Family/patient expects to be discharged to:: Private residence Living Arrangements: Alone Available Help at Discharge:  Family Type of Home: House Home Access: [place]: One level;Two level Home Equipment: None      Prior Function Level of Independence: Independent               Hand Dominance        Extremity/Trunk Assessment   Upper Extremity Assessment: Overall WFL for tasks assessed           Lower Extremity Assessment: LLE deficits/detail   LLE Deficits / Details: able to assist  with SLR; AAROM 10-40*     Communication   Communication: No difficulties  Cognition Arousal/Alertness: Awake/alert Behavior During Therapy: WFL for tasks assessed/performed Overall Cognitive Status: Within Functional Limits for tasks assessed                      General Comments      Exercises Total Joint Exercises Ankle Circles/Pumps: AROM;Both;10 reps Quad Sets: 10 reps;Both;AROM      Assessment/Plan    PT Assessment    PT Diagnosis     PT Problem List    PT Treatment Interventions     PT Goals (Current goals can be found in the Care Plan section) Acute Rehab PT Goals Patient Stated Goal: I and less pain PT Goal Formulation: With patient Time For Goal Achievement: 02/16/14 Potential to Achieve Goals: Good    Frequency     Barriers to discharge  Co-evaluation               End of Session Equipment Utilized During Treatment: Gait belt;Left knee immobilizer Activity Tolerance: Patient tolerated treatment well Patient left: in chair;with call bell/phone within reach           Time: 0830-0857 PT Time Calculation (min): 27 min   Charges:     PT Treatments $Gait Training: 23-37 mins   PT G Codes:          Lakresha Stifter 26-Feb-2014, 10:33 AM

## 2014-02-09 NOTE — Progress Notes (Signed)
02/09/14 1300  PT Visit Information  Last PT Received On 02/09/14  Assistance Needed +1  History of Present Illness s/p L TKA  PT Time Calculation  PT Start Time 1326  PT Stop Time 1349  PT Time Calculation (min) 23 min  Subjective Data  Patient Stated Goal I and less pain  Precautions  Precautions Fall;Knee  Required Braces or Orthoses Knee Immobilizer - Left  Knee Immobilizer - Left Discontinue once straight leg raise with < 10 degree lag  Restrictions  Other Position/Activity Restrictions WBAT  Pain Assessment  Pain Assessment 0-10  Pain Score 5  Pain Location L knee  Pain Intervention(s) Monitored during session;Ice applied  Cognition  Arousal/Alertness Awake/alert  Behavior During Therapy WFL for tasks assessed/performed  Overall Cognitive Status Within Functional Limits for tasks assessed  Bed Mobility  Overal bed mobility Needs Assistance  Bed Mobility Sit to Supine  Sit to supine Min assist  General bed mobility comments min with RLE  Transfers  Overall transfer level Needs assistance  Equipment used Rolling walker (2 wheeled)  Transfers Sit to/from Stand  Sit to Stand Min assist  General transfer comment cues for hand placement and LLE position  Ambulation/Gait  Ambulation/Gait assistance Min guard;Min assist  Ambulation Distance (Feet) 110 Feet  Assistive device Rolling walker (2 wheeled)  Gait Pattern/deviations Step-to pattern;Antalgic  General Gait Details cues for RW position form self, step length, upward gaze  Total Joint Exercises  Heel Slides AROM;10 reps;Left  Hip ABduction/ADduction AROM;AAROM;Left;10 reps  Straight Leg Raises AROM;AAROM;Left;10 reps  PT - End of Session  Equipment Utilized During Treatment Gait belt;Left knee immobilizer  Activity Tolerance Patient tolerated treatment well  Patient left in bed;with call bell/phone within reach  Nurse Communication Mobility status  PT - Assessment/Plan  PT Plan Current plan remains appropriate   PT Frequency 7X/week  Follow Up Recommendations Home health PT  PT equipment Rolling walker with 5" wheels  PT Goal Progression  Progress towards PT goals Progressing toward goals  Acute Rehab PT Goals  PT Goal Formulation With patient  Time For Goal Achievement 02/16/14  Potential to Achieve Goals Good  PT General Charges  $$ ACUTE PT VISIT 1 Procedure  PT Treatments  $Gait Training 8-22 mins  $Therapeutic Exercise 8-22 mins

## 2014-02-09 NOTE — Progress Notes (Signed)
CARE MANAGEMENT NOTE 02/09/2014  Patient:  REBBIE, LAURICELLA   Account Number:  [id number]  Date Initiated:  02/09/2014  Documentation initiated by:  Zarai Orsborn  Subjective/Objective Assessment:   left total knee replacement     Action/Plan:   home with family   Anticipated DC Date:  02/12/2014   Anticipated DC Plan:  [place] referral  NA      DC Planning Services  CM consult      [facility] Choice  NA   Choice offered to / List presented to:  C-1 Patient   DME arranged  [place]      DME agency  [place] arranged  [place]   Status of service:  In process, will continue to follow Medicare Important Message given?  NA - LOS <3 / Initial given by admissions (If response is "NO", the following Medicare IM given date fields will be blank) Date Medicare IM given:   Medicare IM given by:   Date Additional Medicare IM given:   Additional Medicare IM given by:    Discharge Disposition:    Per UR Regulation:  Reviewed for med. necessity/level of care/duration of stay  If discussed at [place] of Stay Meetings, dates discussed:    Comments:  Suanne Marker Norm Wray,RN,BSN,CCM

## 2014-02-09 NOTE — Progress Notes (Signed)
[facility]  [facility] is providing the following services: RW and Commode  If patient discharges after hours, please call [phone].   Linward Headland 02/09/2014, 12:55 PM

## 2014-02-10 LAB — BASIC METABOLIC PANEL
ANION GAP: 11 (ref 5–15)
BUN: 14 mg/dL (ref 6–23)
CALCIUM: 8.6 mg/dL (ref 8.4–10.5)
CO2: 23 mEq/L (ref 19–32)
Chloride: 106 mEq/L (ref 96–112)
Creatinine, Ser: 0.87 mg/dL (ref 0.50–1.10)
GFR calc Af Amer: 89 mL/min — ABNORMAL LOW (ref >90)
GFR, EST NON AFRICAN AMERICAN: 77 mL/min — AB (ref >90)
Glucose, Bld: 168 mg/dL — ABNORMAL HIGH (ref 70–99)
Potassium: 4.4 mEq/L (ref 3.7–5.3)
Sodium: 140 mEq/L (ref 137–147)

## 2014-02-10 LAB — CBC
HCT: 31.6 % — ABNORMAL LOW (ref 36.0–46.0)
Hemoglobin: 10.3 g/dL — ABNORMAL LOW (ref 12.0–15.0)
MCH: 27.2 pg (ref 26.0–34.0)
MCHC: 32.6 g/dL (ref 30.0–36.0)
MCV: 83.6 fL (ref 78.0–100.0)
PLATELETS: 344 10*3/uL (ref 150–400)
RBC: 3.78 MIL/uL — AB (ref 3.87–5.11)
RDW: 14.9 % (ref 11.5–15.5)
WBC: 14.8 10*3/uL — ABNORMAL HIGH (ref 4.0–10.5)

## 2014-02-10 MED ORDER — TRAMADOL HCL 50 MG PO TABS
50.0000 mg | ORAL_TABLET | Freq: Four times a day (QID) | ORAL | Status: DC | PRN
Start: 2014-02-10 — End: 2015-08-24

## 2014-02-10 MED ORDER — METHOCARBAMOL 500 MG PO TABS: 500.0000 mg | ORAL_TABLET | Freq: Four times a day (QID) | ORAL | Status: DC | PRN

## 2014-02-10 MED ORDER — RIVAROXABAN 10 MG PO TABS: 10.0000 mg | ORAL_TABLET | Freq: Every day | ORAL | Status: DC

## 2014-02-10 MED ORDER — OXYCODONE HCL 5 MG PO TABS: 5.0000 mg | ORAL_TABLET | ORAL | Status: DC | PRN

## 2014-02-10 NOTE — Progress Notes (Signed)
   Subjective: 2 Days Post-Op Procedure(s) (LRB): LEFT TOTAL KNEE ARTHROPLASTY (Left) Patient reports pain as mild.   Plan is to go Home after hospital stay.  Objective: Vital signs in last 24 hours: Temp:  [98 F (36.7 C)-98.8 F (37.1 C)] 98.3 F (36.8 C) (08/26 0519) Pulse Rate:  [72-79] 79 (08/26 0519) Resp:  [16-18] 18 (08/26 0519) BP: (118-132)/(68-81) 132/74 mmHg (08/26 0519) SpO2:  [96 %-100 %] 96 % (08/26 0519)  Intake/Output from previous day:  Intake/Output Summary (Last 24 hours) at 02/10/14 0715 Last data filed at 02/10/14 9381  Gross per 24 hour  Intake   1340 ml  Output   1150 ml  Net    190 ml    Intake/Output this shift:    Labs:  Recent Labs  02/09/14 0438 02/10/14 0445  HGB 10.9* 10.3*    Recent Labs  02/09/14 0438 02/10/14 0445  WBC 13.1* 14.8*  RBC 4.06 3.78*  HCT 34.0* 31.6*  PLT 366 344    Recent Labs  02/09/14 0438 02/10/14 0445  NA 136* 140  K 4.2 4.4  CL 102 106  CO2 23 23  BUN 13 14  CREATININE 0.75 0.87  GLUCOSE 151* 168*  CALCIUM 8.5 8.6   No results found for this basename: LABPT, INR,  in the last 72 hours  EXAM General - Patient is Alert, Appropriate and Oriented Extremity - Neurologically intact Neurovascular intact Incision: dressing C/D/I No cellulitis present Compartment soft Dressing - dressing C/D/I Motor Function - intact, moving foot and toes well on exam.    Past Medical History  Diagnosis Date  . Hypertension   . Arthritis     shoulder; BOTH KNEES  . Anemia     IRON DEFICIENCY ANEMIA    Assessment/Plan: 2 Days Post-Op Procedure(s) (LRB): LEFT TOTAL KNEE ARTHROPLASTY (Left) Principal Problem:   OA (osteoarthritis) of knee Active Problems:   Hyponatremia   Advance diet Up with therapy Discharge home with home health  DVT Prophylaxis - Xarelto Weight-Bearing as tolerated to left leg   [place]y V 02/10/2014, 7:15 AM

## 2014-02-10 NOTE — Discharge Summary (Signed)
Physician Discharge Summary   Patient ID: Denise Rogers MRN: [id number] DOB/AGE: 1965-06-23 48 y.o.  Admit date: 02/08/2014 Discharge date: 02/10/2014  Primary Diagnosis:  Osteoarthritis Left knee(s)  Admission Diagnoses:  Past Medical History  Diagnosis Date  . Hypertension   . Arthritis     shoulder; BOTH KNEES  . Anemia     IRON DEFICIENCY ANEMIA   Discharge Diagnoses:   Principal Problem:   OA (osteoarthritis) of knee Active Problems:   Hyponatremia  Estimated body mass index is 35.04 kg/(m^2) as calculated from the following:   Height as of this encounter: '5\' 6"'  (1.676 m).   Weight as of this encounter: 98.431 kg (217 lb).  Procedure:  Procedure(s) (LRB): LEFT TOTAL KNEE ARTHROPLASTY (Left)   Consults: None  HPI: Denise Rogers is a 48 y.o. year old female with end stage OA of her left knee with progressively worsening pain and dysfunction. She has constant pain, with activity and at rest and significant functional deficits with difficulties even with ADLs. She has had extensive non-op management including analgesics, injections of cortisone and viscosupplements, and home exercise program, but remains in significant pain with significant dysfunction. Radiographs show bone on bone arthritis medial and patellofemoral. She presents now for left Total Knee Arthroplasty.  Laboratory Data: Admission on 02/08/2014, Discharged on 02/10/2014  Component Date Value Ref Range Status  . ABO/RH(D) 02/08/2014 A POS   Final  . Antibody Screen 02/08/2014 NEG   Final  . Sample Expiration 02/08/2014 02/11/2014   Final  . ABO/RH(D) 02/08/2014 A POS   Final  . WBC 02/09/2014 13.1* 4.0 - 10.5 K/uL Final  . RBC 02/09/2014 4.06  3.87 - 5.11 MIL/uL Final  . Hemoglobin 02/09/2014 10.9* 12.0 - 15.0 g/dL Final  . HCT 02/09/2014 34.0* 36.0 - 46.0 % Final  . MCV 02/09/2014 83.7  78.0 - 100.0 fL Final  . MCH 02/09/2014 26.8  26.0 - 34.0 pg Final  . MCHC 02/09/2014 32.1  30.0 -  36.0 g/dL Final  . RDW 02/09/2014 14.7  11.5 - 15.5 % Final  . Platelets 02/09/2014 366  150 - 400 K/uL Final  . Sodium 02/09/2014 136* 137 - 147 mEq/L Final  . Potassium 02/09/2014 4.2  3.7 - 5.3 mEq/L Final  . Chloride 02/09/2014 102  96 - 112 mEq/L Final  . CO2 02/09/2014 23  19 - 32 mEq/L Final  . Glucose, Bld 02/09/2014 151* 70 - 99 mg/dL Final  . BUN 02/09/2014 13  6 - 23 mg/dL Final  . Creatinine, Ser 02/09/2014 0.75  0.50 - 1.10 mg/dL Final  . Calcium 02/09/2014 8.5  8.4 - 10.5 mg/dL Final  . GFR calc non Af Amer 02/09/2014 >90  >90 mL/min Final  . GFR calc Af Amer 02/09/2014 >90  >90 mL/min Final   Comment: (NOTE)                          The eGFR has been calculated using the CKD EPI equation.                          This calculation has not been validated in all clinical situations.                          eGFR's persistently <90 mL/min signify possible Chronic Kidney  Disease.  . Anion gap 02/09/2014 11  5 - 15 Final  . WBC 02/10/2014 14.8* 4.0 - 10.5 K/uL Final  . RBC 02/10/2014 3.78* 3.87 - 5.11 MIL/uL Final  . Hemoglobin 02/10/2014 10.3* 12.0 - 15.0 g/dL Final  . HCT 02/10/2014 31.6* 36.0 - 46.0 % Final  . MCV 02/10/2014 83.6  78.0 - 100.0 fL Final  . MCH 02/10/2014 27.2  26.0 - 34.0 pg Final  . MCHC 02/10/2014 32.6  30.0 - 36.0 g/dL Final  . RDW 02/10/2014 14.9  11.5 - 15.5 % Final  . Platelets 02/10/2014 344  150 - 400 K/uL Final  . Sodium 02/10/2014 140  137 - 147 mEq/L Final  . Potassium 02/10/2014 4.4  3.7 - 5.3 mEq/L Final  . Chloride 02/10/2014 106  96 - 112 mEq/L Final  . CO2 02/10/2014 23  19 - 32 mEq/L Final  . Glucose, Bld 02/10/2014 168* 70 - 99 mg/dL Final  . BUN 02/10/2014 14  6 - 23 mg/dL Final  . Creatinine, Ser 02/10/2014 0.87  0.50 - 1.10 mg/dL Final  . Calcium 02/10/2014 8.6  8.4 - 10.5 mg/dL Final  . GFR calc non Af Amer 02/10/2014 77* >90 mL/min Final  . GFR calc Af Amer 02/10/2014 89* >90 mL/min Final   Comment:  (NOTE)                          The eGFR has been calculated using the CKD EPI equation.                          This calculation has not been validated in all clinical situations.                          eGFR's persistently <90 mL/min signify possible Chronic Kidney                          Disease.  Georgiann Hahn gap 02/10/2014 11  5 - 15 Final  Hospital Outpatient Visit on 01/28/2014  Component Date Value Ref Range Status  . MRSA, PCR 01/28/2014 INVALID RESULTS, SPECIMEN SENT FOR CULTURE* NEGATIVE Final   FAISON,M. @ 0677 ON 01/28/14 BY MCCOY,N.  . Staphylococcus aureus 01/28/2014 INVALID RESULTS, SPECIMEN SENT FOR CULTURE* NEGATIVE Final   Comment: FAISON,M. @ 0340 ON 01/28/14 BY MCCOY, N.                                                           The Xpert SA Assay (FDA                          approved for NASAL specimens                          in patients over 66 years of age),                          is one component of  a comprehensive surveillance                          program.  Test performance has                          been validated by [facility] for patients greater                          than or equal to 18 year old.                          It is not intended                          to diagnose infection nor to                          guide or monitor treatment.  Marland Kitchen aPTT 01/28/2014 32  24 - 37 seconds Final  . WBC 01/28/2014 7.4  4.0 - 10.5 K/uL Final  . RBC 01/28/2014 4.25  3.87 - 5.11 MIL/uL Final  . Hemoglobin 01/28/2014 11.4* 12.0 - 15.0 g/dL Final  . HCT 01/28/2014 34.8* 36.0 - 46.0 % Final  . MCV 01/28/2014 81.9  78.0 - 100.0 fL Final  . MCH 01/28/2014 26.8  26.0 - 34.0 pg Final  . MCHC 01/28/2014 32.8  30.0 - 36.0 g/dL Final  . RDW 01/28/2014 14.9  11.5 - 15.5 % Final  . Platelets 01/28/2014 356  150 - 400 K/uL Final  . Sodium 01/28/2014 138  137 - 147 mEq/L Final  . Potassium 01/28/2014 3.9  3.7 - 5.3  mEq/L Final  . Chloride 01/28/2014 102  96 - 112 mEq/L Final  . CO2 01/28/2014 25  19 - 32 mEq/L Final  . Glucose, Bld 01/28/2014 102* 70 - 99 mg/dL Final  . BUN 01/28/2014 15  6 - 23 mg/dL Final  . Creatinine, Ser 01/28/2014 0.80  0.50 - 1.10 mg/dL Final  . Calcium 01/28/2014 9.0  8.4 - 10.5 mg/dL Final  . Total Protein 01/28/2014 7.0  6.0 - 8.3 g/dL Final  . Albumin 01/28/2014 3.6  3.5 - 5.2 g/dL Final  . AST 01/28/2014 16  0 - 37 U/L Final  . ALT 01/28/2014 15  0 - 35 U/L Final  . Alkaline Phosphatase 01/28/2014 79  39 - 117 U/L Final  . Total Bilirubin 01/28/2014 0.2* 0.3 - 1.2 mg/dL Final  . GFR calc non Af Amer 01/28/2014 85* >90 mL/min Final  . GFR calc Af Amer 01/28/2014 >90  >90 mL/min Final   Comment: (NOTE)                          The eGFR has been calculated using the CKD EPI equation.                          This calculation has not been validated in all clinical situations.  eGFR's persistently <90 mL/min signify possible Chronic Kidney                          Disease.  . Anion gap 01/28/2014 11  5 - 15 Final  . Prothrombin Time 01/28/2014 13.8  11.6 - 15.2 seconds Final  . INR 01/28/2014 1.06  0.00 - 1.49 Final  . Color, Urine 01/28/2014 YELLOW  YELLOW Final  . APPearance 01/28/2014 CLEAR  CLEAR Final  . Specific Gravity, Urine 01/28/2014 1.019  1.005 - 1.030 Final  . pH 01/28/2014 6.0  5.0 - 8.0 Final  . Glucose, UA 01/28/2014 NEGATIVE  NEGATIVE mg/dL Final  . Hgb urine dipstick 01/28/2014 NEGATIVE  NEGATIVE Final  . Bilirubin Urine 01/28/2014 NEGATIVE  NEGATIVE Final  . Ketones, ur 01/28/2014 NEGATIVE  NEGATIVE mg/dL Final  . Protein, ur 01/28/2014 NEGATIVE  NEGATIVE mg/dL Final  . Urobilinogen, UA 01/28/2014 1.0  0.0 - 1.0 mg/dL Final  . Nitrite 01/28/2014 NEGATIVE  NEGATIVE Final  . Leukocytes, UA 01/28/2014 NEGATIVE  NEGATIVE Final   MICROSCOPIC NOT DONE ON URINES WITH NEGATIVE PROTEIN, BLOOD, LEUKOCYTES, NITRITE, OR GLUCOSE <1000  mg/dL.  Marland Kitchen Specimen Description 01/28/2014 NOSE   Final  . Special Requests 01/28/2014 NONE   Final  . Culture 01/28/2014    Final                   Value:NO STAPHYLOCOCCUS AUREUS ISOLATED                         Note: No MRSA Isolated                         Performed at [facility]  . Report Status 01/28/2014 01/30/2014 FINAL   Final     X-Rays:Dg Chest 2 View  01/28/2014   CLINICAL DATA:  History of hypertension, preoperative evaluation for knee surgery  EXAM: CHEST  2 VIEW  COMPARISON:  None.  FINDINGS: The heart size and mediastinal contours are within normal limits. Both lungs are clear. The visualized skeletal structures are unremarkable.  IMPRESSION: No active cardiopulmonary disease.   Electronically Signed   By: Inez Catalina M.D.   On: 01/28/2014 10:10    EKG: Orders placed during the hospital encounter of 01/28/14  . EKG 12-LEAD  . EKG 12-LEAD     Hospital Course: Denise Rogers is a 48 y.o. who was admitted to [facility]. They were brought to the operating room on 02/08/2014 and underwent Procedure(s): LEFT TOTAL KNEE ARTHROPLASTY.  Patient tolerated the procedure well and was later transferred to the recovery room and then to the orthopaedic floor for postoperative care.  They were given PO and IV analgesics for pain control following their surgery.  They were given 24 hours of postoperative antibiotics of  Anti-infectives   Start     Dose/Rate Route Frequency Ordered Stop   02/08/14 1900  ceFAZolin (ANCEF) IVPB 2 g/50 mL premix     2 g 100 mL/hr over 30 Minutes Intravenous Every 6 hours 02/08/14 1604 02/09/14 0030   02/08/14 0945  ceFAZolin (ANCEF) IVPB 2 g/50 mL premix     2 g 100 mL/hr over 30 Minutes Intravenous On call to O.R. 02/08/14 0920 02/08/14 1255     and started on DVT prophylaxis in the form of Xarelto.   PT and OT were ordered for total joint protocol.  Discharge planning consulted to help with postop disposition and equipment needs.   Patient had a decent night on the evening of surgery.  They started to get up OOB with therapy on day one. Hemovac drain was pulled without difficulty.  Continued to work with therapy into day two.  Dressing was changed on day two and the incision was healing well.  Patient was seen in rounds and was ready to go home.   Diet: Cardiac diet Activity:WBAT Follow-up:in 2 weeks Disposition - Home Discharged Condition: good       Discharge Instructions   Call MD / Call 911    Complete by:  As directed   If you experience chest pain or shortness of breath, CALL 911 and be transported to the hospital emergency room.  If you develope a fever above 101 F, pus (white drainage) or increased drainage or redness at the wound, or calf pain, call your surgeon's office.     Change dressing    Complete by:  As directed   Change dressing daily with sterile 4 x 4 inch gauze dressing and apply TED hose. Do not submerge the incision under water.     Constipation Prevention    Complete by:  As directed   Drink plenty of fluids.  Prune juice may be helpful.  You may use a stool softener, such as Colace (over the counter) 100 mg twice a day.  Use MiraLax (over the counter) for constipation as needed.     Diet - low sodium heart healthy    Complete by:  As directed      Discharge instructions    Complete by:  As directed   Pick up stool softner and laxative for home. Do not submerge incision under water. May shower. Continue to use ice for pain and swelling from surgery. Take Xarelto for two and a half more weeks, then discontinue Xarelto. Once the patient has completed the Xarelto, they may resume the 81 mg Aspirin.     Do not put a pillow under the knee. Place it under the heel.    Complete by:  As directed      Do not sit on low chairs, stoools or toilet seats, as it may be difficult to get up from low surfaces    Complete by:  As directed      Driving restrictions    Complete by:  As directed   No  driving until released by the physician.     Increase activity slowly as tolerated    Complete by:  As directed      Lifting restrictions    Complete by:  As directed   No lifting until released by the physician.     Patient may shower    Complete by:  As directed   You may shower without a dressing once there is no drainage.  Do not wash over the wound.  If drainage remains, do not shower until drainage stops.     TED hose    Complete by:  As directed   Use stockings (TED hose) for 3 weeks on both leg(s).  You may remove them at night for sleeping.     Weight bearing as tolerated    Complete by:  As directed             Medication List    STOP taking these medications       aspirin EC 81 MG tablet     Biotin 5000 MCG  Tabs     Calcium Carbonate-Vitamin D 600-200 MG-UNIT Tabs     multivitamin with minerals tablet     OMEGA 3 PO      TAKE these medications       lisinopril-hydrochlorothiazide 10-12.5 MG per tablet  Commonly known as:  PRINZIDE,ZESTORETIC  Take 1 tablet by mouth every morning.     methocarbamol 500 MG tablet  Commonly known as:  ROBAXIN  Take 1 tablet (500 mg total) by mouth every 6 (six) hours as needed for muscle spasms.     oxyCODONE 5 MG immediate release tablet  Commonly known as:  Oxy IR/ROXICODONE  Take 1-2 tablets (5-10 mg total) by mouth every 3 (three) hours as needed for moderate pain, severe pain or breakthrough pain.     rivaroxaban 10 MG Tabs tablet  Commonly known as:  XARELTO  - Take 1 tablet (10 mg total) by mouth daily with breakfast. Take Xarelto for two and a half more weeks, then discontinue Xarelto.  - Once the patient has completed the Xarelto, they may resume the 81 mg Aspirin.     traMADol 50 MG tablet  Commonly known as:  ULTRAM  Take 1-2 tablets (50-100 mg total) by mouth every 6 (six) hours as needed (mild to moderate pain).       Follow-up Information   Follow up with Gearlean Alf, MD. Schedule an appointment  as soon as possible for a visit on 02/23/2014. (Call [phone] tomorrow to make the appointment)    Specialty:  Orthopedic Surgery   Contact information:   [address] [place] [address] [phone]       Signed: Arlee Muslim, PA-C Orthopaedic Surgery 02/18/2014, 6:25 AM

## 2014-02-10 NOTE — Progress Notes (Signed)
Physical Therapy Treatment Patient Details Name: Denise Rogers MRN: [id number] DOB: April 28, 1966 Today's Date: 02/10/2014    History of Present Illness s/p L TKA    PT Comments    POD # 2 pt ready for D/C to home with family support.  Assisted with amb in hallway then educated on HEP performing all supine TKR TE's following handout.    Follow Up Recommendations  Home health PT     Equipment Recommendations  Rolling walker with 5" wheels    Recommendations for Other Services       Precautions / Restrictions Precautions Precautions: Fall;Knee Required Braces or Orthoses: Knee Immobilizer - Left Knee Immobilizer - Left: Discontinue once straight leg raise with < 10 degree lag Restrictions Weight Bearing Restrictions: No Other Position/Activity Restrictions: WBAT    Mobility  Bed Mobility Overal bed mobility: Needs Assistance       Supine to sit: Supervision     General bed mobility comments: instructed and demonstrated pt how to use a belt to assist LE on/off bed  Transfers Overall transfer level: Needs assistance Equipment used: Rolling walker (2 wheeled) Transfers: Sit to/from Stand Sit to Stand: Min guard         General transfer comment: cues for hand placement and LLE position plus increased time  Ambulation/Gait Ambulation/Gait assistance: Min guard Ambulation Distance (Feet): 115 Feet Assistive device: Rolling walker (2 wheeled) Gait Pattern/deviations: Step-to pattern Gait velocity: decreased   General Gait Details: cues for RW position form self, step length, and safety with turns   Stairs Stairs:  (pt stated she is using ramp enterance)          Wheelchair Mobility    Modified Rankin (Stroke Patients Only)       Balance                                    Cognition                            Exercises   Total Knee Replacement TE's 10 reps B LE ankle pumps 10 reps towel squeezes 10 reps knee  presses 10 reps heel slides  10 reps SAQ's 10 reps SLR's 10 reps ABD Followed by ICE     General Comments        Pertinent Vitals/Pain      Home Living                      Prior Function            PT Goals (current goals can now be found in the care plan section) Progress towards PT goals: Progressing toward goals    Frequency  7X/week    PT Plan      Co-evaluation             End of Session Equipment Utilized During Treatment: Gait belt;Left knee immobilizer Activity Tolerance: Patient tolerated treatment well Patient left: in bed;with call bell/phone within reach     Time: 1030-1102 PT Time Calculation (min): 32 min  Charges:  $Gait Training: 8-22 mins $Therapeutic Exercise: 8-22 mins                    G Codes:      Denise Rogers  PTA [facility]  Acute  Rehab Pager      [phone]

## 2014-03-17 ENCOUNTER — Other Ambulatory Visit: Payer: Self-pay

## 2014-03-17 DIAGNOSIS — Z1231 Encounter for screening mammogram for malignant neoplasm of breast: Secondary | ICD-10-CM

## 2014-04-13 ENCOUNTER — Ambulatory Visit
Admission: RE | Admit: 2014-04-13 | Discharge: 2014-04-13 | Disposition: A | Discharge status: Home / Self Care | Payer: 59 | Source: Ambulatory Visit

## 2014-04-13 DIAGNOSIS — Z1231 Encounter for screening mammogram for malignant neoplasm of breast: Secondary | ICD-10-CM

## 2015-08-24 ENCOUNTER — Encounter: Payer: Self-pay | Admitting: Podiatry

## 2015-08-24 ENCOUNTER — Ambulatory Visit (INDEPENDENT_AMBULATORY_CARE_PROVIDER_SITE_OTHER): Payer: Commercial Managed Care - HMO | Admitting: Podiatry

## 2015-08-24 ENCOUNTER — Ambulatory Visit (INDEPENDENT_AMBULATORY_CARE_PROVIDER_SITE_OTHER): Payer: Commercial Managed Care - HMO

## 2015-08-24 VITALS — BP 120/78 | HR 71 | Resp 16 | Ht 66.0 in | Wt 240.0 lb

## 2015-08-24 DIAGNOSIS — L84 Corns and callosities: Secondary | ICD-10-CM

## 2015-08-24 DIAGNOSIS — M779 Enthesopathy, unspecified: Secondary | ICD-10-CM

## 2015-08-24 DIAGNOSIS — M79675 Pain in left toe(s): Secondary | ICD-10-CM

## 2015-08-24 MED ORDER — TRIAMCINOLONE ACETONIDE 10 MG/ML IJ SUSP
10.0000 mg | Freq: Once | INTRAMUSCULAR | Status: AC
  Administered 2015-08-24: 10 mg

## 2015-08-24 NOTE — Progress Notes (Signed)
Subjective:     Patient ID: Denise Rogers, female   DOB: 19-Apr-1966, 50 y.o.   MRN: [id number]  HPI patient states I'm getting a lot of pain under my left foot with lesion formation and fluid and also my feet get tired and painful in general. States it's been going on for at least 6 months   Review of Systems  All other systems reviewed and are negative.      Objective:   Physical Exam  Constitutional: She is oriented to person, place, and time.  Cardiovascular: Intact distal pulses.   Musculoskeletal: Normal range of motion.  Neurological: She is oriented to person, place, and time.  Skin: Skin is warm.  Nursing note and vitals reviewed.  neurovascular status intact muscle strength adequate range of motion within normal limits with patient found have moderate depression of the arch bilateral with inflammation and also keratotic lesion with fluid buildup around the fifth MPJ that's painful when pressed and making walking difficult. Good digital perfusion and well oriented 3     Assessment:     Inflammatory capsulitis fifth MPJ left with fluid buildup and keratotic lesion formation along with moderate depression of the arch with chronic tendinitis    Plan:     H&P and x-rays reviewed with patient. Today I went ahead and I did a careful injection around the fifth MPJ plantar capsule 3 mg dexamethasone Kenalog 5 mill grams Xylocaine and debrided the lesion in full. I then scanned for Berkley type orthotic to provide for complete arch support and reduce stresses against her foot and arch and take pressure off the fifth MPJ. Patient will be seen back for orthotic pickup  X-ray report indicates lesion is directly on the head of the fifth metatarsal indicating pressure bone spot

## 2015-08-24 NOTE — Progress Notes (Signed)
   Subjective:    Patient ID: Denise Rogers, female    DOB: 1966-01-30, 50 y.o.   MRN: [id number]  HPI   Patient presents with a callous on their Left foot; lateral side below 5th toe.  Pt is pre-diabetic; sugar=145 this am    Review of Systems  Eyes: Positive for redness.  Musculoskeletal: Positive for back pain and gait problem.  Neurological: Positive for numbness.  All other systems reviewed and are negative.      Objective:   Physical Exam        Assessment & Plan:

## 2015-08-30 ENCOUNTER — Encounter: Payer: Commercial Managed Care - HMO | Attending: Family Medicine

## 2015-08-30 VITALS — Ht 66.0 in | Wt 239.5 lb

## 2015-08-30 DIAGNOSIS — E559 Vitamin D deficiency, unspecified: Secondary | ICD-10-CM | POA: Diagnosis not present

## 2015-08-30 DIAGNOSIS — D649 Anemia, unspecified: Secondary | ICD-10-CM | POA: Diagnosis not present

## 2015-08-30 DIAGNOSIS — E119 Type 2 diabetes mellitus without complications: Secondary | ICD-10-CM | POA: Insufficient documentation

## 2015-08-30 DIAGNOSIS — Z79899 Other long term (current) drug therapy: Secondary | ICD-10-CM | POA: Diagnosis not present

## 2015-08-30 DIAGNOSIS — I10 Essential (primary) hypertension: Secondary | ICD-10-CM | POA: Diagnosis not present

## 2015-09-05 NOTE — Progress Notes (Signed)
Patient was seen on 08-30-15 for the first of a series of three diabetes self-management courses at the [facility].  Patient Education Plan per assessed needs and concerns is to attend four course education program for Diabetes Self Management Education.  The following learning objectives were met by the patient during this class:  Describe diabetes  State some common risk factors for diabetes  Defines the role of glucose and insulin  Identifies type of diabetes and pathophysiology  Describe the relationship between diabetes and cardiovascular risk  State the members of the Healthcare Team  States the rationale for glucose monitoring  State when to test glucose  State their individual Target Range  State the importance of logging glucose readings  Describe how to interpret glucose readings  Identifies A1C target  Explain the correlation between A1c and eAG values  State symptoms and treatment of high blood glucose  State symptoms and treatment of low blood glucose  Explain proper technique for glucose testing  Identifies proper sharps disposal  Handouts given during class include:  Living Well with Diabetes book  Carb Counting and Meal Planning book  Meal Plan Card  Carbohydrate guide  Meal planning worksheet  Low Sodium Flavoring Tips  The diabetes portion plate  A1c to eAG Conversion Chart  Diabetes Medications  Diabetes Recommended Care Schedule  Support Group  Diabetes Success Plan  Core Class Satisfaction Survey  Follow-Up Plan:  Attend core 2    

## 2015-09-06 DIAGNOSIS — E119 Type 2 diabetes mellitus without complications: Secondary | ICD-10-CM

## 2015-09-06 NOTE — Progress Notes (Signed)

## 2015-09-15 ENCOUNTER — Encounter: Payer: Self-pay | Admitting: Family Medicine

## 2015-09-16 ENCOUNTER — Encounter: Payer: Self-pay | Admitting: Family Medicine

## 2015-09-20 NOTE — Progress Notes (Signed)
Patient was seen on 09/15/15 for the third of a series of three diabetes self-management courses at the [facility].   Catalina Gravel the amount of activity recommended for healthy living . Describe activities suitable for individual needs . Identify ways to regularly incorporate activity into daily life . Identify barriers to activity and ways to over come these barriers  Identify diabetes medications being personally used and their primary action for lowering glucose and possible side effects . Describe role of stress on blood glucose and develop strategies to address psychosocial issues . Identify diabetes complications and ways to prevent them  Explain how to manage diabetes during illness . Evaluate success in meeting personal goal . Establish 2-3 goals that they will plan to diligently work on until they return for the  89-month follow-up visit  Goals:   Not completed by patient  Your patient has identified these potential barriers to change:  Not completed by patient  Your patient has identified their diabetes self-care support plan as  Not completed by patient Plan:  Attend Support Group as desired

## 2016-03-22 ENCOUNTER — Other Ambulatory Visit: Payer: Self-pay | Admitting: Family Medicine

## 2016-03-22 DIAGNOSIS — Z1231 Encounter for screening mammogram for malignant neoplasm of breast: Secondary | ICD-10-CM

## 2016-04-06 ENCOUNTER — Ambulatory Visit: Payer: Commercial Managed Care - HMO

## 2016-04-20 ENCOUNTER — Ambulatory Visit
Admission: RE | Admit: 2016-04-20 | Discharge: 2016-04-20 | Disposition: A | Discharge status: Home / Self Care | Payer: Commercial Managed Care - HMO | Source: Ambulatory Visit | Attending: Family Medicine | Admitting: Family Medicine

## 2016-04-20 DIAGNOSIS — Z1231 Encounter for screening mammogram for malignant neoplasm of breast: Secondary | ICD-10-CM

## 2016-08-06 ENCOUNTER — Ambulatory Visit (INDEPENDENT_AMBULATORY_CARE_PROVIDER_SITE_OTHER): Payer: Commercial Managed Care - HMO | Admitting: Podiatry

## 2016-08-06 ENCOUNTER — Encounter: Payer: Self-pay | Admitting: Podiatry

## 2016-08-06 VITALS — BP 114/80 | HR 69 | Resp 16

## 2016-08-06 DIAGNOSIS — L84 Corns and callosities: Secondary | ICD-10-CM

## 2016-08-06 DIAGNOSIS — M779 Enthesopathy, unspecified: Secondary | ICD-10-CM

## 2016-08-06 MED ORDER — TRIAMCINOLONE ACETONIDE 10 MG/ML IJ SUSP
10.0000 mg | Freq: Once | INTRAMUSCULAR | Status: AC
  Administered 2016-08-06: 10 mg

## 2016-08-06 NOTE — Progress Notes (Signed)
Subjective:     Patient ID: Denise Rogers, female   DOB: 10/22/1965, 51 y.o.   MRN: [id number]  HPI patient presents stating I'm having a lot of discomfort in the outside of my left foot and it's making it hard for me to be active   Review of Systems     Objective:   Physical Exam Neurovascular status intact with inflammation pain and fluid buildup around the fifth MPJ left plantar with keratotic lesion formation    Assessment:     Inflammatory capsulitis with porokeratotic lesion fifth metatarsal left    Plan:     Injected the capsule of the fifth MPJ 3 mg Kenalog 5 mg Xylocaine and did deep debridement of the lesion with no iatrogenic bleeding. Applied sterile dressing reappoint to recheck as needed

## 2016-09-27 DIAGNOSIS — R7309 Other abnormal glucose: Secondary | ICD-10-CM | POA: Diagnosis not present

## 2016-09-27 DIAGNOSIS — I10 Essential (primary) hypertension: Secondary | ICD-10-CM | POA: Diagnosis not present

## 2016-09-27 DIAGNOSIS — E559 Vitamin D deficiency, unspecified: Secondary | ICD-10-CM | POA: Diagnosis not present

## 2016-09-27 DIAGNOSIS — E119 Type 2 diabetes mellitus without complications: Secondary | ICD-10-CM | POA: Diagnosis not present

## 2016-09-27 DIAGNOSIS — Z79899 Other long term (current) drug therapy: Secondary | ICD-10-CM | POA: Diagnosis not present

## 2016-09-27 DIAGNOSIS — Z0000 Encounter for general adult medical examination without abnormal findings: Secondary | ICD-10-CM | POA: Diagnosis not present

## 2016-10-04 ENCOUNTER — Telehealth: Payer: Self-pay | Admitting: Podiatry

## 2016-10-04 NOTE — Telephone Encounter (Signed)
Left message for patient to pick up orthotics that were made on 09/08/2015.

## 2017-03-28 DIAGNOSIS — E119 Type 2 diabetes mellitus without complications: Secondary | ICD-10-CM | POA: Diagnosis not present

## 2017-04-11 ENCOUNTER — Ambulatory Visit (INDEPENDENT_AMBULATORY_CARE_PROVIDER_SITE_OTHER): Payer: 59 | Admitting: Podiatry

## 2017-04-11 DIAGNOSIS — L600 Ingrowing nail: Secondary | ICD-10-CM

## 2017-04-11 DIAGNOSIS — M779 Enthesopathy, unspecified: Secondary | ICD-10-CM

## 2017-04-11 NOTE — Patient Instructions (Signed)

## 2017-04-11 NOTE — Progress Notes (Signed)
Subjective:    Patient ID: Denise Rogers, female   DOB: 51 y.o.   MRN: [id number]   HPI patient presents stating that the left foot the capsule is improved somewhat with occasional pain but she has a very painful ingrown toenail the right big toe    ROS      Objective:  Physical Exam neurovascular status intact with incurvated right hallux lateral border that's very tender and inflammation around the fifth MPJ left that's improved but present     Assessment:    Ingrown toenail deformity right hallux lateral border with inflammatory capsulitis fifth MPJ left     Plan:  Reviewed both conditions and for the left were just can continue to wear stable shoes. Recommend correction of the right and explained procedure and allow patient to sign consent form after review. I went ahead and I infiltrated the right hallux 60 mg like Marcaine mixture remove the lateral border exposed matrix and applied phenol 3 applications 30 seconds followed by alcohol lavaged sterile dressing. Gave instructions on soaks and reappoint

## 2017-04-24 ENCOUNTER — Other Ambulatory Visit: Payer: Self-pay | Admitting: Family Medicine

## 2017-04-24 DIAGNOSIS — Z139 Encounter for screening, unspecified: Secondary | ICD-10-CM

## 2017-05-24 ENCOUNTER — Ambulatory Visit
Admission: RE | Admit: 2017-05-24 | Discharge: 2017-05-24 | Disposition: A | Discharge status: Home / Self Care | Payer: 59 | Source: Ambulatory Visit | Attending: Family Medicine | Admitting: Family Medicine

## 2017-05-24 DIAGNOSIS — Z1231 Encounter for screening mammogram for malignant neoplasm of breast: Secondary | ICD-10-CM | POA: Diagnosis not present

## 2017-05-24 DIAGNOSIS — Z139 Encounter for screening, unspecified: Secondary | ICD-10-CM

## 2017-09-05 ENCOUNTER — Ambulatory Visit: Payer: 59 | Admitting: Podiatry

## 2017-09-05 ENCOUNTER — Encounter: Payer: Self-pay | Admitting: Podiatry

## 2017-09-05 DIAGNOSIS — M779 Enthesopathy, unspecified: Secondary | ICD-10-CM

## 2017-09-05 DIAGNOSIS — M7750 Other enthesopathy of unspecified foot: Secondary | ICD-10-CM

## 2017-09-05 MED ORDER — TRIAMCINOLONE ACETONIDE 10 MG/ML IJ SUSP
10.0000 mg | Freq: Once | INTRAMUSCULAR | Status: AC
  Administered 2017-09-05: 10 mg

## 2017-09-05 MED ORDER — DICLOFENAC SODIUM 75 MG PO TBEC: 75.0000 mg | DELAYED_RELEASE_TABLET | Freq: Two times a day (BID) | ORAL | 2 refills | Status: DC

## 2017-09-05 NOTE — Progress Notes (Signed)
Subjective:   Patient ID: Denise Rogers, female   DOB: 52 y.o.   MRN: [id number]   HPI Patient presents with diabetes with chronic lesion underneath the fifth metatarsal left that gets very tender when she tries to walk on it.  It did get better for several months after we trimmed it but it is starting to become very painful again she works on cement floors neurovascular status intact with moderate arch depression noted bilateral with keratotic lesion sub-fifth metatarsal left   ROS      Objective:  Physical Exam  That is very painful when pressed with fluid buildup.  Ingrown toenail deformity right hallux is healing well with some scar tissue but no pain currently     Assessment:  Plantarflexed metatarsal with inflammatory keratotic lesion and fluid buildup and ingrown toenail that healed well right hallux     Plan:  H&P condition reviewed and at this time for the left I did carefully inject the fifth MPJ 3 mg dexamethasone Kenalog 5 mg Xylocaine debrided the lesion and then went ahead and scan for a deep seated 12 mm orthotic to also try to reduce the pressure against the fifth MPJ left with offloading.  Patient will be seen back when those are ready

## 2017-10-28 DIAGNOSIS — E559 Vitamin D deficiency, unspecified: Secondary | ICD-10-CM | POA: Diagnosis not present

## 2017-10-28 DIAGNOSIS — E119 Type 2 diabetes mellitus without complications: Secondary | ICD-10-CM | POA: Diagnosis not present

## 2017-10-28 DIAGNOSIS — Z79899 Other long term (current) drug therapy: Secondary | ICD-10-CM | POA: Diagnosis not present

## 2017-10-28 DIAGNOSIS — I10 Essential (primary) hypertension: Secondary | ICD-10-CM | POA: Diagnosis not present

## 2017-10-28 DIAGNOSIS — Z1211 Encounter for screening for malignant neoplasm of colon: Secondary | ICD-10-CM | POA: Diagnosis not present

## 2017-10-28 DIAGNOSIS — Z0000 Encounter for general adult medical examination without abnormal findings: Secondary | ICD-10-CM | POA: Diagnosis not present

## 2018-04-07 DIAGNOSIS — E119 Type 2 diabetes mellitus without complications: Secondary | ICD-10-CM | POA: Diagnosis not present

## 2018-05-12 DIAGNOSIS — M25531 Pain in right wrist: Secondary | ICD-10-CM | POA: Diagnosis not present

## 2018-05-22 ENCOUNTER — Other Ambulatory Visit: Payer: Self-pay | Admitting: Family Medicine

## 2018-05-22 DIAGNOSIS — Z1231 Encounter for screening mammogram for malignant neoplasm of breast: Secondary | ICD-10-CM

## 2018-06-05 DIAGNOSIS — M25531 Pain in right wrist: Secondary | ICD-10-CM | POA: Insufficient documentation

## 2018-06-05 DIAGNOSIS — G5601 Carpal tunnel syndrome, right upper limb: Secondary | ICD-10-CM | POA: Diagnosis not present

## 2018-07-07 ENCOUNTER — Ambulatory Visit
Admission: RE | Admit: 2018-07-07 | Discharge: 2018-07-07 | Disposition: A | Discharge status: Home / Self Care | Payer: 59 | Source: Ambulatory Visit | Attending: Family Medicine | Admitting: Family Medicine

## 2018-07-07 DIAGNOSIS — Z1231 Encounter for screening mammogram for malignant neoplasm of breast: Secondary | ICD-10-CM

## 2018-07-08 DIAGNOSIS — G5601 Carpal tunnel syndrome, right upper limb: Secondary | ICD-10-CM | POA: Diagnosis not present

## 2018-07-08 DIAGNOSIS — M25531 Pain in right wrist: Secondary | ICD-10-CM | POA: Diagnosis not present

## 2018-07-16 DIAGNOSIS — M25531 Pain in right wrist: Secondary | ICD-10-CM | POA: Diagnosis not present

## 2018-07-22 DIAGNOSIS — M24131 Other articular cartilage disorders, right wrist: Secondary | ICD-10-CM | POA: Diagnosis not present

## 2019-01-07 ENCOUNTER — Other Ambulatory Visit: Payer: Self-pay

## 2019-01-07 ENCOUNTER — Encounter: Payer: Self-pay | Admitting: Podiatry

## 2019-01-07 ENCOUNTER — Ambulatory Visit: Payer: 59 | Admitting: Podiatry

## 2019-01-07 DIAGNOSIS — Q828 Other specified congenital malformations of skin: Secondary | ICD-10-CM

## 2019-01-07 DIAGNOSIS — E119 Type 2 diabetes mellitus without complications: Secondary | ICD-10-CM

## 2019-01-07 NOTE — Progress Notes (Signed)
This patient presents to the office with pain localized to her solitary callus.  She says she has pain walking and wearing her shoes.  She says she has worked on the callus at home.  She has provided no professional treatment. She says she stands 12 hours a day, and seven days a week. She presents to the office for evaluation and treatment.  Vascular  Dorsalis pedis and posterior tibial pulses are palpable  B/L.  Capillary return  WNL.  Temperature gradient is  WNL.  Skin turgor  WNL  Sensorium  Senn Weinstein monofilament wire  WNL. Normal tactile sensation.  Nail Exam  Patient has normal nails with no evidence of bacterial or fungal infection.  Orthopedic  Exam  Muscle tone and muscle strength  WNL.  No limitations of motion feet  B/L.  No crepitus or joint effusion noted.  Foot type is unremarkable and digits show no abnormalities.  Bony prominences are unremarkable.  Skin  No open lesions.  Normal skin texture and turgor.  Porokeratotic lesion sub 5th metatarsal left foot.  Porokeratosis  sub 5th met left foot.  IE  Debride porokeratosis.  Discussed condition with this patient.  RTC 3 months.   Gardiner Barefoot DPM

## 2019-05-01 ENCOUNTER — Encounter: Payer: Self-pay | Admitting: Podiatry

## 2019-05-01 ENCOUNTER — Ambulatory Visit: Payer: 59 | Admitting: Podiatry

## 2019-05-01 ENCOUNTER — Other Ambulatory Visit: Payer: Self-pay

## 2019-05-01 DIAGNOSIS — M2042 Other hammer toe(s) (acquired), left foot: Secondary | ICD-10-CM | POA: Diagnosis not present

## 2019-05-01 NOTE — Progress Notes (Signed)
This patient presents to the office with pain in her fifth toe left foot.  She states she experiences pain and discomfort in this fifth toe.  She says that work requires significant standing and walking.  She presents the office today for an evaluation and treatment of this fifth toe left foot.  Vascular  Dorsalis pedis and posterior tibial pulses are palpable  B/L.  Capillary return  WNL.  Temperature gradient is  WNL.  Skin turgor  WNL  Sensorium  Senn Weinstein monofilament wire  WNL. Normal tactile sensation.  Nail Exam  Patient has normal nails with no evidence of bacterial or fungal infection.  Orthopedic  Exam  Muscle tone and muscle strength  WNL.  No limitations of motion feet  B/L.  No crepitus or joint effusion noted.  Adducto-varus fifth toe left foot.  Bony prominences are unremarkable.  Skin  No open lesions.  Normal skin texture and turgor.  Porokeratotic lesion sub 5th metatarsal left foot. Hammer toe fifth toe left foot.  ROV.   Discussed condition with this patient.  Told her to change her footwear as. Needed.  RTC 3 months.   Gardiner Barefoot DPM

## 2019-08-13 ENCOUNTER — Other Ambulatory Visit: Payer: Self-pay | Admitting: Family Medicine

## 2019-08-13 DIAGNOSIS — Z1231 Encounter for screening mammogram for malignant neoplasm of breast: Secondary | ICD-10-CM

## 2019-09-15 ENCOUNTER — Other Ambulatory Visit: Payer: Self-pay

## 2019-09-15 ENCOUNTER — Ambulatory Visit
Admission: RE | Admit: 2019-09-15 | Discharge: 2019-09-15 | Disposition: A | Discharge status: Home / Self Care | Payer: 59 | Source: Ambulatory Visit | Attending: Family Medicine | Admitting: Family Medicine

## 2019-09-15 DIAGNOSIS — Z1231 Encounter for screening mammogram for malignant neoplasm of breast: Secondary | ICD-10-CM

## 2019-09-16 ENCOUNTER — Other Ambulatory Visit: Payer: Self-pay | Admitting: Family Medicine

## 2019-09-16 DIAGNOSIS — R928 Other abnormal and inconclusive findings on diagnostic imaging of breast: Secondary | ICD-10-CM

## 2019-09-23 ENCOUNTER — Other Ambulatory Visit: Payer: Self-pay

## 2019-09-23 ENCOUNTER — Ambulatory Visit
Admission: RE | Admit: 2019-09-23 | Discharge: 2019-09-23 | Disposition: A | Discharge status: Home / Self Care | Payer: 59 | Source: Ambulatory Visit | Attending: Family Medicine | Admitting: Family Medicine

## 2019-09-23 ENCOUNTER — Ambulatory Visit: Payer: 59

## 2019-09-23 DIAGNOSIS — R928 Other abnormal and inconclusive findings on diagnostic imaging of breast: Secondary | ICD-10-CM

## 2020-02-05 ENCOUNTER — Encounter: Payer: Self-pay | Admitting: Gastroenterology

## 2020-03-30 ENCOUNTER — Ambulatory Visit (AMBULATORY_SURGERY_CENTER): Payer: Self-pay

## 2020-03-30 ENCOUNTER — Encounter: Payer: Self-pay | Admitting: Gastroenterology

## 2020-03-30 ENCOUNTER — Other Ambulatory Visit: Payer: Self-pay

## 2020-03-30 VITALS — Ht 66.0 in | Wt 235.0 lb

## 2020-03-30 DIAGNOSIS — Z1211 Encounter for screening for malignant neoplasm of colon: Secondary | ICD-10-CM

## 2020-03-30 MED ORDER — PLENVU 140 G PO SOLR: 1.0000 | ORAL | 0 refills | Status: DC

## 2020-03-30 NOTE — Progress Notes (Signed)
No egg or soy allergy known to patient  No issues with past sedation with any surgeries or procedures No intubation problems in the past  No FH of Malignant Hyperthermia No diet pills per patient No home 02 use per patient  No blood thinners per patient  Pt denies issues with constipation  No A fib or A flutter  COVID 19 guidelines implemented in [facility] today with Pt and RN  Coupon given to pt in [facility] today , Code to Pharmacy  COVID vaccines completed on 11/2019 per pt;  Due to the COVID-19 pandemic we are asking patients to follow these guidelines. Please only bring one care partner. Please be aware that your care partner may wait in the car in the parking lot or if they feel like they will be too hot to wait in the car, they may wait in the lobby on the 4th floor. All care partners are required to wear a mask the entire time (we do not have any that we can provide them), they need to practice social distancing, and we will do a Covid check for all patient's and care partners when you arrive. Also we will check their temperature and your temperature. If the care partner waits in their car they need to stay in the parking lot the entire time and we will call them on their cell phone when the patient is ready for discharge so they can bring the car to the front of the building. Also all patient's will need to wear a mask into building.  

## 2020-04-13 ENCOUNTER — Ambulatory Visit (AMBULATORY_SURGERY_CENTER): Payer: 59 | Admitting: Gastroenterology

## 2020-04-13 ENCOUNTER — Other Ambulatory Visit: Payer: Self-pay

## 2020-04-13 ENCOUNTER — Encounter: Payer: Self-pay | Admitting: Gastroenterology

## 2020-04-13 VITALS — BP 123/80 | HR 76 | Temp 96.6°F | Resp 19 | Ht 66.0 in | Wt 235.0 lb

## 2020-04-13 DIAGNOSIS — K635 Polyp of colon: Secondary | ICD-10-CM

## 2020-04-13 DIAGNOSIS — D124 Benign neoplasm of descending colon: Secondary | ICD-10-CM

## 2020-04-13 DIAGNOSIS — Z1211 Encounter for screening for malignant neoplasm of colon: Secondary | ICD-10-CM

## 2020-04-13 DIAGNOSIS — D123 Benign neoplasm of transverse colon: Secondary | ICD-10-CM

## 2020-04-13 MED ORDER — SODIUM CHLORIDE 0.9 % IV SOLN: 500.0000 mL | INTRAVENOUS | Status: DC

## 2020-04-13 NOTE — Progress Notes (Signed)
Report given to PACU, vss 

## 2020-04-13 NOTE — Progress Notes (Signed)
Called to room to assist during endoscopic procedure.  Patient ID and intended procedure confirmed with present staff. Received instructions for my participation in the procedure from the performing physician.  

## 2020-04-13 NOTE — Op Note (Signed)
[facility] Patient Name: Denise Rogers Procedure Date: 04/13/2020 7:19 AM MRN: [id number] Endoscopist: Ladene Artist , MD Age: 54 Referring MD:  Date of Birth: [birth year] Gender: Female Account #: [id number] Procedure:                Colonoscopy Indications:              Screening for colorectal malignant neoplasm Medicines:                Monitored Anesthesia Care Procedure:                Pre-Anesthesia Assessment:                           - Prior to the procedure, a History and Physical                            was performed, and patient medications and                            allergies were reviewed. The patient's tolerance of                            previous anesthesia was also reviewed. The risks                            and benefits of the procedure and the sedation                            options and risks were discussed with the patient.                            All questions were answered, and informed consent                            was obtained. Prior Anticoagulants: The patient has                            taken no previous anticoagulant or antiplatelet                            agents. ASA Grade Assessment: II - A patient with                            mild systemic disease. After reviewing the risks                            and benefits, the patient was deemed in                            satisfactory condition to undergo the procedure.                           After obtaining informed consent, the colonoscope  was passed under direct vision. Throughout the                            procedure, the patient's blood pressure, pulse, and                            oxygen saturations were monitored continuously. The                            Colonoscope was introduced through the anus and                            advanced to the the cecum, identified by                            appendiceal orifice  and ileocecal valve. The                            ileocecal valve, appendiceal orifice, and rectum                            were photographed. The quality of the bowel                            preparation was good. The colonoscopy was performed                            without difficulty. The patient tolerated the                            procedure well. Scope In: 8:22:34 AM Scope Out: 6:57:84 AM Scope Withdrawal Time: 0 hours 10 minutes 42 seconds  Total Procedure Duration: 0 hours 14 minutes 12 seconds  Findings:                 The perianal and digital rectal examinations were                            normal.                           Two sessile polyps were found in the descending                            colon and transverse colon. The polyps were 5 mm in                            size. These polyps were removed with a cold snare.                            Resection and retrieval were complete.                           Scattered small-mouthed diverticula were found in  the right colon.                           The exam was otherwise without abnormality on                            direct and retroflexion views. Complications:            No immediate complications. Estimated blood loss:                            None. Estimated Blood Loss:     Estimated blood loss: none. Impression:               - Two 5 mm polyps in the descending colon and in                            the transverse colon, removed with a cold snare.                            Resected and retrieved.                           - Mild diverticulosis in the right colon.                           - The examination was otherwise normal on direct                            and retroflexion views. Recommendation:           - Repeat colonoscopy after studies are complete for                            surveillance based on pathology results.                           -  Patient has a contact number available for                            emergencies. The signs and symptoms of potential                            delayed complications were discussed with the                            patient. Return to normal activities tomorrow.                            Written discharge instructions were provided to the                            patient.                           - Resume previous diet.                           -  Continue present medications.                           - Await pathology results. Ladene Artist, MD 04/13/2020 8:46:28 AM This report has been signed electronically.

## 2020-04-13 NOTE — Patient Instructions (Signed)
Please read handouts provided. Continue present medications. Await pathology results.   YOU HAD AN ENDOSCOPIC PROCEDURE TODAY AT THE [facility]:   Refer to the procedure report that was given to you for any specific questions about what was found during the examination.  If the procedure report does not answer your questions, please call your gastroenterologist to clarify.  If you requested that your care partner not be given the details of your procedure findings, then the procedure report has been included in a sealed envelope for you to review at your convenience later.  YOU SHOULD EXPECT: Some feelings of bloating in the abdomen. Passage of more gas than usual.  Walking can help get rid of the air that was put into your GI tract during the procedure and reduce the bloating. If you had a lower endoscopy (such as a colonoscopy or flexible sigmoidoscopy) you may notice spotting of blood in your stool or on the toilet paper. If you underwent a bowel prep for your procedure, you may not have a normal bowel movement for a few days.  Please Note:  You might notice some irritation and congestion in your nose or some drainage.  This is from the oxygen used during your procedure.  There is no need for concern and it should clear up in a day or so.  SYMPTOMS TO REPORT IMMEDIATELY:  Following lower endoscopy (colonoscopy or flexible sigmoidoscopy):  Excessive amounts of blood in the stool  Significant tenderness or worsening of abdominal pains  Swelling of the abdomen that is new, acute  Fever of 100F or higher   For urgent or emergent issues, a gastroenterologist can be reached at any hour by calling [phone]. Do not use MyChart messaging for urgent concerns.    DIET:  We do recommend a small meal at first, but then you may proceed to your regular diet.  Drink plenty of fluids but you should avoid alcoholic beverages for 24 hours.  ACTIVITY:  You should plan to take it easy  for the rest of today and you should NOT DRIVE or use heavy machinery until tomorrow (because of the sedation medicines used during the test).    FOLLOW UP: Our staff will call the number listed on your records 48-72 hours following your procedure to check on you and address any questions or concerns that you may have regarding the information given to you following your procedure. If we do not reach you, we will leave a message.  We will attempt to reach you two times.  During this call, we will ask if you have developed any symptoms of COVID 19. If you develop any symptoms (ie: fever, flu-like symptoms, shortness of breath, cough etc.) before then, please call [phone].  If you test positive for Covid 19 in the 2 weeks post procedure, please call and report this information to us.    If any biopsies were taken you will be contacted by phone or by letter within the next 1-3 weeks.  Please call us at [phone] if you have not heard about the biopsies in 3 weeks.    SIGNATURES/CONFIDENTIALITY: You and/or your care partner have signed paperwork which will be entered into your electronic medical record.  These signatures attest to the fact that that the information above on your After Visit Summary has been reviewed and is understood.  Full responsibility of the confidentiality of this discharge information lies with you and/or your care-partner.  

## 2020-04-15 ENCOUNTER — Telehealth: Payer: Self-pay | Admitting: *Deleted

## 2020-04-15 NOTE — Telephone Encounter (Signed)
  Follow up Call-  Call back number 04/13/2020  Post procedure Call Back phone  # [phone]  Permission to leave phone message Yes  Some recent data might be hidden     No answer at 2nd attempt follow up phone call.  Left message on voicemail.

## 2020-04-15 NOTE — Telephone Encounter (Signed)
No answer for post procedure call back. Left message for patient to call and will call back later today.

## 2020-04-25 ENCOUNTER — Encounter: Payer: Self-pay | Admitting: Gastroenterology

## 2020-10-14 ENCOUNTER — Other Ambulatory Visit: Payer: Self-pay | Admitting: Family Medicine

## 2020-10-14 DIAGNOSIS — Z1231 Encounter for screening mammogram for malignant neoplasm of breast: Secondary | ICD-10-CM

## 2020-12-22 ENCOUNTER — Other Ambulatory Visit: Payer: Self-pay

## 2020-12-22 ENCOUNTER — Ambulatory Visit
Admission: RE | Admit: 2020-12-22 | Discharge: 2020-12-22 | Disposition: A | Discharge status: Home / Self Care | Payer: 59 | Source: Ambulatory Visit | Attending: Family Medicine | Admitting: Family Medicine

## 2020-12-22 DIAGNOSIS — Z1231 Encounter for screening mammogram for malignant neoplasm of breast: Secondary | ICD-10-CM

## 2021-12-13 ENCOUNTER — Other Ambulatory Visit: Payer: Self-pay | Admitting: Family Medicine

## 2021-12-13 DIAGNOSIS — Z1231 Encounter for screening mammogram for malignant neoplasm of breast: Secondary | ICD-10-CM

## 2021-12-25 ENCOUNTER — Ambulatory Visit
Admission: RE | Admit: 2021-12-25 | Discharge: 2021-12-25 | Disposition: A | Discharge status: Home / Self Care | Payer: 59 | Source: Ambulatory Visit | Attending: Family Medicine | Admitting: Family Medicine

## 2021-12-25 DIAGNOSIS — Z1231 Encounter for screening mammogram for malignant neoplasm of breast: Secondary | ICD-10-CM

## 2022-06-12 ENCOUNTER — Emergency Department (HOSPITAL_BASED_OUTPATIENT_CLINIC_OR_DEPARTMENT_OTHER)
Admission: EM | Admit: 2022-06-12 | Discharge: 2022-06-12 | Disposition: A | Discharge status: Home / Self Care | Payer: 59 | Attending: Emergency Medicine | Admitting: Emergency Medicine

## 2022-06-12 ENCOUNTER — Encounter (HOSPITAL_BASED_OUTPATIENT_CLINIC_OR_DEPARTMENT_OTHER): Payer: Self-pay | Admitting: Emergency Medicine

## 2022-06-12 ENCOUNTER — Other Ambulatory Visit: Payer: Self-pay

## 2022-06-12 ENCOUNTER — Ambulatory Visit: Payer: Self-pay | Admitting: *Deleted

## 2022-06-12 DIAGNOSIS — E1165 Type 2 diabetes mellitus with hyperglycemia: Secondary | ICD-10-CM | POA: Diagnosis present

## 2022-06-12 DIAGNOSIS — Z7982 Long term (current) use of aspirin: Secondary | ICD-10-CM | POA: Diagnosis not present

## 2022-06-12 DIAGNOSIS — Z7984 Long term (current) use of oral hypoglycemic drugs: Secondary | ICD-10-CM | POA: Diagnosis not present

## 2022-06-12 LAB — I-STAT VENOUS BLOOD GAS, ED
Acid-Base Excess: 0 mmol/L (ref 0.0–2.0)
Bicarbonate: 25.9 mmol/L (ref 20.0–28.0)
Calcium, Ion: 1.16 mmol/L (ref 1.15–1.40)
HCT: 49 % — ABNORMAL HIGH (ref 36.0–46.0)
Hemoglobin: 16.7 g/dL — ABNORMAL HIGH (ref 12.0–15.0)
O2 Saturation: 38 %
Patient temperature: 98
Potassium: 5 mmol/L (ref 3.5–5.1)
Sodium: 126 mmol/L — ABNORMAL LOW (ref 135–145)
TCO2: 27 mmol/L (ref 22–32)
pCO2, Ven: 43.4 mmHg — ABNORMAL LOW (ref 44–60)
pH, Ven: 7.383 (ref 7.25–7.43)
pO2, Ven: 22 mmHg — CL (ref 32–45)

## 2022-06-12 LAB — URINALYSIS, ROUTINE W REFLEX MICROSCOPIC
Bacteria, UA: NONE SEEN
Bilirubin Urine: NEGATIVE
Glucose, UA: >1000 mg/dL — AB
Hgb urine dipstick: NEGATIVE
Ketones, ur: 15 mg/dL — AB
Leukocytes,Ua: NEGATIVE
Nitrite: NEGATIVE
Protein, ur: NEGATIVE mg/dL
Specific Gravity, Urine: 1.033 — ABNORMAL HIGH (ref 1.005–1.030)
pH: 5 (ref 5.0–8.0)

## 2022-06-12 LAB — CBC
HCT: 45.3 % (ref 36.0–46.0)
Hemoglobin: 15 g/dL (ref 12.0–15.0)
MCH: 26.4 pg (ref 26.0–34.0)
MCHC: 33.1 g/dL (ref 30.0–36.0)
MCV: 79.6 fL — ABNORMAL LOW (ref 80.0–100.0)
Platelets: 414 10*3/uL — ABNORMAL HIGH (ref 150–400)
RBC: 5.69 MIL/uL — ABNORMAL HIGH (ref 3.87–5.11)
RDW: 14.5 % (ref 11.5–15.5)
WBC: 13.1 10*3/uL — ABNORMAL HIGH (ref 4.0–10.5)
nRBC: 0 % (ref 0.0–0.2)

## 2022-06-12 LAB — BETA-HYDROXYBUTYRIC ACID: Beta-Hydroxybutyric Acid: 3.79 mmol/L — ABNORMAL HIGH (ref 0.05–0.27)

## 2022-06-12 LAB — BASIC METABOLIC PANEL
Anion gap: 16 — ABNORMAL HIGH (ref 5–15)
BUN: 28 mg/dL — ABNORMAL HIGH (ref 6–20)
CO2: 25 mmol/L (ref 22–32)
Calcium: 9.7 mg/dL (ref 8.9–10.3)
Chloride: 86 mmol/L — ABNORMAL LOW (ref 98–111)
Creatinine, Ser: 1.33 mg/dL — ABNORMAL HIGH (ref 0.44–1.00)
GFR, Estimated: 46 mL/min — ABNORMAL LOW (ref >60)
Glucose, Bld: 454 mg/dL — ABNORMAL HIGH (ref 70–99)
Potassium: 4 mmol/L (ref 3.5–5.1)
Sodium: 127 mmol/L — ABNORMAL LOW (ref 135–145)

## 2022-06-12 LAB — CBG MONITORING, ED
Glucose-Capillary: 410 mg/dL — ABNORMAL HIGH (ref 70–99)
Glucose-Capillary: 434 mg/dL — ABNORMAL HIGH (ref 70–99)
Glucose-Capillary: 297 mg/dL — ABNORMAL HIGH (ref 70–99)
Glucose-Capillary: 209 mg/dL — ABNORMAL HIGH (ref 70–99)

## 2022-06-12 MED ORDER — DEXTROSE 50 % IV SOLN: 0.0000 mL | INTRAVENOUS | Status: DC | PRN

## 2022-06-12 MED ORDER — GLIPIZIDE-METFORMIN HCL 2.5-500 MG PO TABS
1.0000 | ORAL_TABLET | Freq: Two times a day (BID) | ORAL | 0 refills | Status: DC
  Filled 2022-10-30: qty 60, 30d supply, fill #0

## 2022-06-12 MED ORDER — LACTATED RINGERS IV SOLN: INTRAVENOUS | Status: DC

## 2022-06-12 MED ORDER — INSULIN ASPART PROT & ASPART (70-30 MIX) 100 UNIT/ML ~~LOC~~ SUSP
10.0000 [IU] | Freq: Once | SUBCUTANEOUS | Status: AC
  Administered 2022-06-12: 10 [IU] via SUBCUTANEOUS
  Filled 2022-06-12: qty 10

## 2022-06-12 MED ORDER — LACTATED RINGERS IV BOLUS
1000.0000 mL | Freq: Once | INTRAVENOUS | Status: AC
  Administered 2022-06-12: 1000 mL via INTRAVENOUS

## 2022-06-12 NOTE — Inpatient Diabetes Management (Signed)
Inpatient Diabetes Program Recommendations  AACE/ADA: New Consensus Statement on Inpatient Glycemic Control (2015)  Target Ranges:  Prepandial:   less than 140 mg/dL      Peak postprandial:   less than 180 mg/dL (1-2 hours)      Critically ill patients:  140 - 180 mg/dL   Lab Results  Component Value Date   GLUCAP 434 (H) 06/12/2022    Review of Glycemic Control  Latest Reference Range & Units 06/12/22 11:18  CO2 22 - 32 mmol/L 25  Glucose 70 - 99 mg/dL 454 (H)  Anion gap 5 - 15  16 (H)  (H): Data is abnormally high  Diabetes history: DM2 Outpatient Diabetes medications: Metaglip 2.5-500 BID, Wilder Glade (started in November) Current orders for Inpatient glycemic control: IVF  Inpatient Diabetes Program Recommendations:    [facility] Lank in November per RN note.  Please consider obtaining a beta-hydroxybutyrate.  If elevated might consider IV insulin.    Per note, patient has been feeling dehydrated and having hyperglycemia since starting on a SGLT-2i.  Please consider discontinuing Farxiga at discharge.    Will continue to follow while inpatient.  Thank you, Reche Dixon, MSN, [facility] Coordinator Inpatient Diabetes Program [phone] (team pager from 8a-5p)

## 2022-06-12 NOTE — Telephone Encounter (Signed)
  Chief Complaint: Hyperglycemia Symptoms: BS 475 this AM, states has not eaten much in 2 days "No carbs" Frequent urination,very dry mouth, urine cloudy, one episode of vomiting Frequency: This AM Pertinent Negatives: Patient denies  Disposition: '[x]'$ ED /'[]'$ Urgent Care (no appt availability in office) / '[]'$ Appointment(In office/virtual)/ '[]'$  [facility]/ '[]'$ Home Care/ '[]'$ Refused Recommended Disposition /'[]'$ [facility]/ '[]'$  Follow-up with PCP Additional Notes:  States BS usually runs 200. No insulin. Advised ED. States will follow disposition.  Community line call. Reason for Disposition  [1] Vomiting AND [2] signs of dehydration (e.g., very dry mouth, lightheaded, dark urine)  Answer Assessment - Initial Assessment Questions 1. BLOOD GLUCOSE: "What is your blood glucose level?"      475 this AM 2. ONSET: "When did you check the blood glucose?"     This AM 3. USUAL RANGE: "What is your glucose level usually?" (e.g., usual fasting morning value, usual evening value)     Highest ever 200 4. KETONES: "Do you check for ketones (urine or blood test strips)?" If Yes, ask: "What does the test show now?"      no 5. TYPE 1 or 2:  "Do you know what type of diabetes you have?"  (e.g., Type 1, Type 2, Gestational; doesn't know)      2 6. INSULIN: "Do you take insulin?" "What type of insulin(s) do you use? What is the mode of delivery? (syringe, pen; injection or pump)?"      None 7. DIABETES PILLS: "Do you take any pills for your diabetes?" If Yes, ask: "Have you missed taking any pills recently?"     No missed doses. Changed from metformin to [place] 2 months ago. 8. OTHER SYMPTOMS: "Do you have any symptoms?" (e.g., fever, frequent urination, difficulty breathing, dizziness, weakness, vomiting)     Frequent urination vomiting, very dry mouth, urine cloudy, "Don't feel right."  Protocols used: Diabetes - High Blood Sugar-A-AH

## 2022-06-12 NOTE — Discharge Instructions (Addendum)
You came to the emergency department today with an elevated blood sugar.  Your urine also has large amounts of sugar in it, indicating that your diabetes is very poorly controlled.  Please make an appointment with your primary care doctor as soon as possible to discuss a different regimen.  For the time being, we will stop your Wilder Glade and restart your metformin as requested.  Do not hesitate to return to the emergency department with any worsening symptoms.  It was a pleasure to meet you and we hope you feel better.  Continue to stay hydrated!

## 2022-06-12 NOTE — ED Provider Notes (Signed)
[facility] EMERGENCY DEPT Provider Note   CSN: [id number] Arrival date & time: 06/12/22  1028     History  Chief Complaint  Patient presents with   Hyperglycemia    Denise Rogers is a 56 y.o. female with a past medical history of type 2 diabetes presenting today with hyperglycemia.  She reports that today her meter was reading in the 500s.  She was recently changed from metformin to [place].  She did not take the [place] today but has been taking it regularly otherwise.  Endorsing polydipsia and polyuria.  Does report eating sweeter foods and rolls for Christmas yesterday.    Hyperglycemia      Home Medications Prior to Admission medications   Medication Sig Start Date End Date Taking? Authorizing Provider  amoxicillin (AMOXIL) 500 MG capsule Take 500 mg by mouth 3 (three) times daily. Patient not taking: Reported on 04/13/2020 03/15/20   [provider]  aspirin 81 MG tablet Take 81 mg by mouth daily.    [provider]  BIOTIN 5000 PO Take 1 tablet by mouth daily.    [provider]  calcium carbonate (CALCIUM 600) 600 MG TABS tablet Take 600 mg by mouth daily.    [provider]  glipiZIDE-metformin (METAGLIP) 2.5-500 MG tablet Take 1 tablet by mouth 2 (two) times daily. 04/19/19   [provider]  MELATONIN PO Take 5 mg by mouth at bedtime as needed.    [provider]  Multiple Vitamins-Minerals (MULTIVITAMIN ADULT PO) Take 1 tablet by mouth daily.    [provider]  olmesartan-hydrochlorothiazide (BENICAR HCT) 20-12.5 MG tablet  01/06/19   [provider]  Omega-3 Fatty Acids (FISH OIL) 1000 MG CAPS Take 1 capsule by mouth daily.    [provider]  PEG-KCl-NaCl-NaSulf-Na Asc-C (PLENVU) 140 g SOLR Take 1 kit by mouth as directed. Manufacturer's coupon Universal coupon code:BIN: P2366821; GROUP: [id number]; PCN: [id number]; ID: [id number]; PAY NO MORE $50 03/30/20   Ladene Artist, MD  rosuvastatin (CRESTOR) 5 MG tablet  01/06/19   [provider]      Allergies    Lisinopril    Review of Systems   Review of Systems  Physical Exam Updated Vital Signs BP 100/75 (BP Location: Right Arm)   Pulse 100   Temp 98.3 F (36.8 C) (Oral)   Resp 18   Ht _0  (1.702 m)   Wt 92.5 kg   LMP 04/19/2011   SpO2 99%   BMI 31.95 kg/m  Physical Exam Vitals and nursing note reviewed.  Constitutional:      General: She is not in acute distress.    Appearance: Normal appearance. She is not ill-appearing.  HENT:     Head: Normocephalic and atraumatic.     Mouth/Throat:     Mouth: Mucous membranes are dry.     Pharynx: Oropharynx is clear.  Eyes:     General: No scleral icterus.    Conjunctiva/sclera: Conjunctivae normal.  Cardiovascular:     Rate and Rhythm: Normal rate and regular rhythm.  Pulmonary:     Effort: Pulmonary effort is normal. No respiratory distress.  Skin:    Findings: No rash.  Neurological:     Mental Status: She is alert.  Psychiatric:        Mood and Affect: Mood normal.     ED Results / Procedures / Treatments   Labs (all labs ordered are listed, but only abnormal results are displayed) Labs Reviewed  BASIC METABOLIC PANEL - Abnormal; Notable for the following components:      Result Value   Sodium 127 (*)    Chloride 86 (*)    Glucose, Bld 454 (*)    BUN 28 (*)    Creatinine, Ser 1.33 (*)    GFR, Estimated 46 (*)    Anion gap 16 (*)    All other components within normal limits  CBC - Abnormal; Notable for the following components:   WBC 13.1 (*)    RBC 5.69 (*)    MCV 79.6 (*)    Platelets 414 (*)    All other components within normal limits  URINALYSIS, ROUTINE W REFLEX MICROSCOPIC - Abnormal; Notable for the following components:   Color, Urine COLORLESS (*)    Specific Gravity, Urine 1.033 (*)    Glucose, UA >1,000 (*)    Ketones, ur 15 (*)    All other components within normal limits  CBG MONITORING,  ED - Abnormal; Notable for the following components:   Glucose-Capillary 410 (*)    All other components within normal limits  CBG MONITORING, ED - Abnormal; Notable for the following components:   Glucose-Capillary 434 (*)    All other components within normal limits  I-STAT VENOUS BLOOD GAS, ED - Abnormal; Notable for the following components:   pCO2, Ven 43.4 (*)    pO2, Ven 22 (*)    Sodium 126 (*)    HCT 49.0 (*)    Hemoglobin 16.7 (*)    All other components within normal limits  CBG MONITORING, ED - Abnormal; Notable for the following components:   Glucose-Capillary 297 (*)    All other components within normal limits  CBG MONITORING, ED - Abnormal; Notable for the following components:   Glucose-Capillary 209 (*)    All other components within normal limits  BETA-HYDROXYBUTYRIC ACID  OSMOLALITY    EKG None  Radiology No results found.  Procedures Procedures   Medications Ordered in ED Medications  dextrose 50 % solution 0-50 mL (has no administration in time range)  lactated ringers infusion (has no administration in time range)  lactated ringers bolus 1,000 mL ( Intravenous Stopped 06/12/22 1509)  insulin aspart protamine- aspart (NOVOLOG MIX 70/30) injection 10 Units (10 Units Subcutaneous Given 06/12/22 1622)  lactated ringers bolus 1,000 mL (1,000 mLs Intravenous New Bag/Given 06/12/22 1622)    ED Course/ Medical Decision Making/ A&P                           Medical Decision Making Amount and/or Complexity of Data Reviewed Labs: ordered.  Risk Prescription drug management.   56 year old female presenting today with a hyperglycemia.  Consider normal hyperglycemia, HHS versus DKA.  Also may be triggered by other viral illness.  This is not an exhaustive differential.    Past Medical History / Co-morbidities / Social History: Type 2 diabetes   Additional history: Unable to see patient's outpatient PCP visit.  I am able to see a phone call to her  PCP this morning where they sent her to the hospital due to her blood sugar being 475.   Physical Exam: Pertinent physical exam findings include Dry mucous membranes  Lab Tests: I ordered, and personally interpreted labs.  The pertinent results include: Sodium 127, corrected to 133 Hyperglycemic however no signs of DKA.  Normal pH. AKI, creatinine 1.33    Cardiac Monitoring:  The patient was maintained on a cardiac monitor.  I viewed and interpreted the cardiac monitored which showed an underlying rhythm of: Normal sinus   Medications: Given fluids, insulin and reassessment shows blood glucose in the 200s   Consultations Obtained: Diabetes care coordinator was involved in her care.  Please see their note  MDM/Disposition: This is a 56 year old female who presented today with a blood sugar in the 500s.  History of diabetes.  Has recently been switched to [place] from her metformin and she says that she does not like the [place].  She believes that it is giving her poor glucose control and making her excessively thirsty and have to urinate often.  On arrival, patient was well-appearing.  No coo small breathing.  She had a normal pH on her blood gas and did not appear to be in DKA or HHS.  She did have an elevated anion gap to 16.  She was thoroughly hydrated and given insulin and her blood sugar came down to 200.  I suspect she lives with some degree of hyperglycemia and I do not believe we need to wait for her to be within the normal range.  As suggested by diabetes care coordinator and after discussion with the patient she will be restarted on metformin and Wilder Glade will be discontinued.  Of note, sodium was 127 but this corrects to a normal level when hyperglycemia is taken into consideration.  Patient is stable and agreeable to discharge with some degree of hyperglycemia.  She will follow-up with her PCP tomorrow.  I have refilled her medications for her to start in the morning.   Final  Clinical Impression(s) / ED Diagnoses Final diagnoses:  Type 2 diabetes mellitus with hyperglycemia, without long-term current use of insulin ([place])    Rx / DC Orders ED Discharge Orders          Ordered    glipiZIDE-metformin (METAGLIP) 2.5-500 MG tablet  2 times daily        06/12/22 1824           Results and diagnoses were explained to the patient. Return precautions discussed in full. Patient had no additional questions and expressed complete understanding.   This chart was dictated using voice recognition software.  Despite best efforts to proofread,  errors can occur which can change the documentation meaning.    Rhae Hammock, PA-C 06/12/22 1833    Gareth Morgan, MD 06/12/22 2356

## 2022-06-12 NOTE — ED Triage Notes (Signed)
Pt arrived POV, caox4, and ambulatory. Pt reports she has had a high blood sugar with her home reading recently. Her PCP put her on a new med [place] in November and she states she has been feeling dehydrated and has issues with her CBG being more elevated than normal since.

## 2022-06-13 ENCOUNTER — Other Ambulatory Visit (HOSPITAL_BASED_OUTPATIENT_CLINIC_OR_DEPARTMENT_OTHER): Payer: Self-pay

## 2022-06-13 ENCOUNTER — Telehealth (HOSPITAL_BASED_OUTPATIENT_CLINIC_OR_DEPARTMENT_OTHER): Payer: Self-pay | Admitting: Emergency Medicine

## 2022-06-13 LAB — OSMOLALITY: Osmolality: 292 mOsm/kg (ref 275–295)

## 2022-06-13 MED ORDER — METFORMIN HCL 500 MG PO TABS
500.0000 mg | ORAL_TABLET | Freq: Two times a day (BID) | ORAL | 0 refills | Status: DC
  Filled 2022-06-13: qty 60, 30d supply, fill #0

## 2022-06-13 MED ORDER — OLMESARTAN MEDOXOMIL-HCTZ 20-12.5 MG PO TABS: 1.0000 | ORAL_TABLET | Freq: Every day | ORAL | 3 refills | Status: DC

## 2022-06-13 MED ORDER — GLIPIZIDE 5 MG PO TABS
2.5000 mg | ORAL_TABLET | Freq: Two times a day (BID) | ORAL | 0 refills | Status: DC
  Filled 2022-06-13: qty 30, 30d supply, fill #0

## 2022-06-13 MED ORDER — ROSUVASTATIN CALCIUM 5 MG PO TABS
5.0000 mg | ORAL_TABLET | ORAL | 3 refills | Status: DC
  Filled 2022-06-13: qty 8, 28d supply, fill #0
  Filled 2022-07-10: qty 8, 28d supply, fill #1
  Filled 2022-08-11 (×2): qty 8, 28d supply, fill #2
  Filled 2022-09-10: qty 8, 28d supply, fill #3
  Filled 2022-10-02: qty 8, 28d supply, fill #4
  Filled 2022-10-30: qty 8, 28d supply, fill #5
  Filled 2022-12-04: qty 8, 28d supply, fill #6
  Filled 2022-12-22 – 2022-12-24 (×2): qty 8, 28d supply, fill #7
  Filled 2023-01-16: qty 8, 28d supply, fill #8

## 2022-06-13 MED ORDER — DAPAGLIFLOZIN PROPANEDIOL 10 MG PO TABS: 10.0000 mg | ORAL_TABLET | Freq: Every day | ORAL | 3 refills | Status: DC

## 2022-06-13 NOTE — Telephone Encounter (Signed)
Needs meds resent

## 2022-06-14 ENCOUNTER — Other Ambulatory Visit (HOSPITAL_BASED_OUTPATIENT_CLINIC_OR_DEPARTMENT_OTHER): Payer: Self-pay

## 2022-06-14 ENCOUNTER — Other Ambulatory Visit: Payer: Self-pay

## 2022-06-26 ENCOUNTER — Other Ambulatory Visit (HOSPITAL_BASED_OUTPATIENT_CLINIC_OR_DEPARTMENT_OTHER): Payer: Self-pay

## 2022-06-26 ENCOUNTER — Other Ambulatory Visit (HOSPITAL_COMMUNITY): Payer: Self-pay

## 2022-06-26 MED ORDER — LANTUS SOLOSTAR 100 UNIT/ML ~~LOC~~ SOPN
PEN_INJECTOR | SUBCUTANEOUS | 5 refills | Status: DC
  Filled 2022-06-26 (×2): qty 6, 30d supply, fill #0
  Filled 2022-07-23: qty 6, 30d supply, fill #1
  Filled 2022-10-01: qty 6, 30d supply, fill #2
  Filled 2022-10-30: qty 6, 30d supply, fill #3

## 2022-06-26 MED ORDER — DEXCOM G7 SENSOR MISC
11 refills | Status: DC
  Filled 2022-06-26 – 2022-10-30 (×4): qty 3, 30d supply, fill #0

## 2022-06-28 ENCOUNTER — Other Ambulatory Visit (HOSPITAL_BASED_OUTPATIENT_CLINIC_OR_DEPARTMENT_OTHER): Payer: Self-pay

## 2022-06-28 MED ORDER — INSULIN PEN NEEDLE 32G X 4 MM MISC
3 refills | Status: DC
  Filled 2022-06-28: qty 100, 90d supply, fill #0
  Filled 2022-10-30: qty 100, 90d supply, fill #1

## 2022-06-29 ENCOUNTER — Other Ambulatory Visit (HOSPITAL_BASED_OUTPATIENT_CLINIC_OR_DEPARTMENT_OTHER): Payer: Self-pay

## 2022-07-16 ENCOUNTER — Other Ambulatory Visit (HOSPITAL_BASED_OUTPATIENT_CLINIC_OR_DEPARTMENT_OTHER): Payer: Self-pay

## 2022-07-16 MED ORDER — OLMESARTAN MEDOXOMIL-HCTZ 20-12.5 MG PO TABS
1.0000 | ORAL_TABLET | Freq: Every day | ORAL | 2 refills | Status: DC
  Filled 2022-07-16: qty 30, 30d supply, fill #0
  Filled 2022-08-11 (×2): qty 30, 30d supply, fill #1
  Filled 2022-09-10: qty 30, 30d supply, fill #2
  Filled 2022-10-20: qty 30, 30d supply, fill #3
  Filled 2022-11-23: qty 30, 30d supply, fill #4
  Filled 2023-01-16: qty 30, 30d supply, fill #5
  Filled 2023-02-13: qty 30, 30d supply, fill #6
  Filled 2023-03-17: qty 30, 30d supply, fill #7
  Filled 2023-04-14: qty 30, 30d supply, fill #8

## 2022-07-24 ENCOUNTER — Other Ambulatory Visit (HOSPITAL_BASED_OUTPATIENT_CLINIC_OR_DEPARTMENT_OTHER): Payer: Self-pay

## 2022-07-25 ENCOUNTER — Other Ambulatory Visit (HOSPITAL_BASED_OUTPATIENT_CLINIC_OR_DEPARTMENT_OTHER): Payer: Self-pay

## 2022-07-27 ENCOUNTER — Other Ambulatory Visit (HOSPITAL_BASED_OUTPATIENT_CLINIC_OR_DEPARTMENT_OTHER): Payer: Self-pay

## 2022-07-27 ENCOUNTER — Other Ambulatory Visit: Payer: Self-pay

## 2022-07-27 MED ORDER — FREESTYLE LIBRE 14 DAY SENSOR MISC
0 refills | Status: DC
  Filled 2022-07-27: qty 2, 28d supply, fill #0

## 2022-07-28 ENCOUNTER — Other Ambulatory Visit (HOSPITAL_BASED_OUTPATIENT_CLINIC_OR_DEPARTMENT_OTHER): Payer: Self-pay

## 2022-07-30 ENCOUNTER — Other Ambulatory Visit (HOSPITAL_BASED_OUTPATIENT_CLINIC_OR_DEPARTMENT_OTHER): Payer: Self-pay

## 2022-07-30 MED ORDER — FREESTYLE LIBRE 14 DAY SENSOR MISC
11 refills | Status: DC
  Filled 2022-07-30 – 2022-10-30 (×2): qty 2, 28d supply, fill #0

## 2022-07-30 MED ORDER — FREESTYLE LIBRE 14 DAY READER DEVI
1 refills | Status: DC
  Filled 2022-07-30: qty 1, 90d supply, fill #0

## 2022-08-01 ENCOUNTER — Other Ambulatory Visit (HOSPITAL_BASED_OUTPATIENT_CLINIC_OR_DEPARTMENT_OTHER): Payer: Self-pay

## 2022-08-03 ENCOUNTER — Other Ambulatory Visit (HOSPITAL_BASED_OUTPATIENT_CLINIC_OR_DEPARTMENT_OTHER): Payer: Self-pay

## 2022-08-09 ENCOUNTER — Other Ambulatory Visit (HOSPITAL_BASED_OUTPATIENT_CLINIC_OR_DEPARTMENT_OTHER): Payer: Self-pay

## 2022-08-11 ENCOUNTER — Other Ambulatory Visit (HOSPITAL_BASED_OUTPATIENT_CLINIC_OR_DEPARTMENT_OTHER): Payer: Self-pay

## 2022-08-13 ENCOUNTER — Other Ambulatory Visit (HOSPITAL_BASED_OUTPATIENT_CLINIC_OR_DEPARTMENT_OTHER): Payer: Self-pay

## 2022-09-03 ENCOUNTER — Other Ambulatory Visit (HOSPITAL_BASED_OUTPATIENT_CLINIC_OR_DEPARTMENT_OTHER): Payer: Self-pay

## 2022-09-03 MED ORDER — OZEMPIC (1 MG/DOSE) 4 MG/3ML ~~LOC~~ SOPN
1.0000 mg | PEN_INJECTOR | SUBCUTANEOUS | 0 refills | Status: DC
  Filled 2022-09-03: qty 3, 28d supply, fill #0

## 2022-09-25 ENCOUNTER — Other Ambulatory Visit (HOSPITAL_BASED_OUTPATIENT_CLINIC_OR_DEPARTMENT_OTHER): Payer: Self-pay

## 2022-09-25 MED ORDER — OZEMPIC (2 MG/DOSE) 8 MG/3ML ~~LOC~~ SOPN
2.0000 mg | PEN_INJECTOR | SUBCUTANEOUS | 5 refills | Status: DC
  Filled 2022-09-25: qty 3, 28d supply, fill #0
  Filled 2022-10-21: qty 3, 28d supply, fill #1
  Filled 2022-11-23: qty 3, 28d supply, fill #2
  Filled 2022-12-22: qty 3, 28d supply, fill #3
  Filled 2023-01-16: qty 3, 28d supply, fill #4
  Filled 2023-02-13: qty 3, 28d supply, fill #5

## 2022-10-01 ENCOUNTER — Other Ambulatory Visit (HOSPITAL_BASED_OUTPATIENT_CLINIC_OR_DEPARTMENT_OTHER): Payer: Self-pay

## 2022-10-01 MED ORDER — BASAGLAR KWIKPEN 100 UNIT/ML ~~LOC~~ SOPN
25.0000 [IU] | PEN_INJECTOR | Freq: Every day | SUBCUTANEOUS | 11 refills | Status: DC
  Filled 2022-10-01: qty 9, 34d supply, fill #0
  Filled 2022-10-30: qty 15, 34d supply, fill #0

## 2022-10-02 ENCOUNTER — Other Ambulatory Visit (HOSPITAL_BASED_OUTPATIENT_CLINIC_OR_DEPARTMENT_OTHER): Payer: Self-pay

## 2022-10-02 MED ORDER — AMOXICILLIN 500 MG PO CAPS
2000.0000 mg | ORAL_CAPSULE | ORAL | 0 refills | Status: DC
  Filled 2022-10-02: qty 12, 3d supply, fill #0

## 2022-10-30 ENCOUNTER — Other Ambulatory Visit (HOSPITAL_BASED_OUTPATIENT_CLINIC_OR_DEPARTMENT_OTHER): Payer: Self-pay

## 2022-10-31 ENCOUNTER — Other Ambulatory Visit (HOSPITAL_BASED_OUTPATIENT_CLINIC_OR_DEPARTMENT_OTHER): Payer: Self-pay

## 2022-10-31 ENCOUNTER — Other Ambulatory Visit: Payer: Self-pay

## 2022-11-03 ENCOUNTER — Other Ambulatory Visit (HOSPITAL_BASED_OUTPATIENT_CLINIC_OR_DEPARTMENT_OTHER): Payer: Self-pay

## 2022-11-03 MED ORDER — INSULIN GLARGINE 100 UNIT/ML ~~LOC~~ SOLN
25.0000 [IU] | Freq: Every day | SUBCUTANEOUS | 5 refills | Status: DC
  Filled 2022-11-03: qty 10, 30d supply, fill #0

## 2022-11-14 ENCOUNTER — Other Ambulatory Visit (HOSPITAL_BASED_OUTPATIENT_CLINIC_OR_DEPARTMENT_OTHER): Payer: Self-pay

## 2022-11-23 ENCOUNTER — Other Ambulatory Visit: Payer: Self-pay

## 2022-11-28 ENCOUNTER — Other Ambulatory Visit (HOSPITAL_BASED_OUTPATIENT_CLINIC_OR_DEPARTMENT_OTHER): Payer: Self-pay

## 2022-12-17 ENCOUNTER — Other Ambulatory Visit: Payer: Self-pay | Admitting: Family Medicine

## 2022-12-17 DIAGNOSIS — Z1231 Encounter for screening mammogram for malignant neoplasm of breast: Secondary | ICD-10-CM

## 2022-12-21 ENCOUNTER — Ambulatory Visit: Payer: 59

## 2022-12-22 ENCOUNTER — Other Ambulatory Visit (HOSPITAL_BASED_OUTPATIENT_CLINIC_OR_DEPARTMENT_OTHER): Payer: Self-pay

## 2022-12-24 ENCOUNTER — Other Ambulatory Visit: Payer: Self-pay

## 2022-12-25 ENCOUNTER — Other Ambulatory Visit (HOSPITAL_BASED_OUTPATIENT_CLINIC_OR_DEPARTMENT_OTHER): Payer: Self-pay

## 2023-02-13 ENCOUNTER — Other Ambulatory Visit (HOSPITAL_BASED_OUTPATIENT_CLINIC_OR_DEPARTMENT_OTHER): Payer: Self-pay

## 2023-02-13 ENCOUNTER — Other Ambulatory Visit: Payer: Self-pay

## 2023-02-13 MED ORDER — ROSUVASTATIN CALCIUM 5 MG PO TABS
5.0000 mg | ORAL_TABLET | ORAL | 0 refills | Status: DC
  Filled 2023-02-13: qty 8, 28d supply, fill #0
  Filled 2023-03-17: qty 8, 28d supply, fill #1
  Filled 2023-04-14: qty 8, 28d supply, fill #2

## 2023-03-17 ENCOUNTER — Other Ambulatory Visit (HOSPITAL_BASED_OUTPATIENT_CLINIC_OR_DEPARTMENT_OTHER): Payer: Self-pay

## 2023-03-17 MED ORDER — OZEMPIC (2 MG/DOSE) 8 MG/3ML ~~LOC~~ SOPN
2.0000 mg | PEN_INJECTOR | SUBCUTANEOUS | 11 refills | Status: DC
  Filled 2023-03-17: qty 3, 28d supply, fill #0
  Filled 2023-04-14: qty 3, 28d supply, fill #1
  Filled 2023-05-08: qty 3, 28d supply, fill #2
  Filled 2023-06-02: qty 3, 28d supply, fill #3
  Filled 2023-06-30: qty 3, 28d supply, fill #4
  Filled 2023-07-28: qty 3, 28d supply, fill #5
  Filled 2023-08-29: qty 3, 28d supply, fill #6
  Filled 2023-09-24: qty 3, 28d supply, fill #7
  Filled 2023-10-23: qty 3, 28d supply, fill #8
  Filled 2023-11-20: qty 3, 28d supply, fill #9
  Filled 2023-12-16: qty 3, 28d supply, fill #10
  Filled 2024-01-13: qty 3, 28d supply, fill #11

## 2023-03-18 ENCOUNTER — Other Ambulatory Visit: Payer: Self-pay

## 2023-03-30 ENCOUNTER — Ambulatory Visit
Admission: RE | Admit: 2023-03-30 | Discharge: 2023-03-30 | Disposition: A | Discharge status: Home / Self Care | Payer: 59 | Source: Ambulatory Visit | Attending: Family Medicine | Admitting: Family Medicine

## 2023-03-30 DIAGNOSIS — Z1231 Encounter for screening mammogram for malignant neoplasm of breast: Secondary | ICD-10-CM

## 2023-04-16 ENCOUNTER — Other Ambulatory Visit (HOSPITAL_BASED_OUTPATIENT_CLINIC_OR_DEPARTMENT_OTHER): Payer: Self-pay

## 2023-05-01 IMAGING — MG MM DIGITAL SCREENING BILAT W/ TOMO AND CAD
8 series · 8 of 24 positions shown · non-contrast
Comparison: Previous exam(s).

CLINICAL DATA: Screening.

EXAM:
DIGITAL SCREENING BILATERAL MAMMOGRAM WITH TOMOSYNTHESIS AND CAD
TECHNIQUE: Bilateral screening digital craniocaudal and mediolateral oblique
mammograms were obtained. Bilateral screening digital breast
tomosynthesis was performed. The images were evaluated with
computer-aided detection.

[L CC synth-2D]
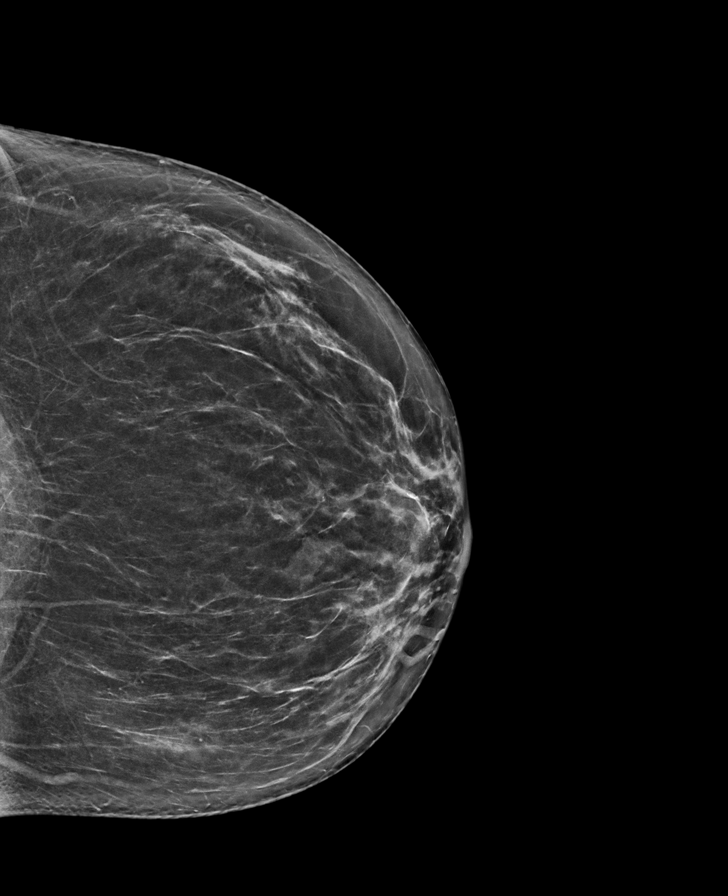

[R CC synth-2D]
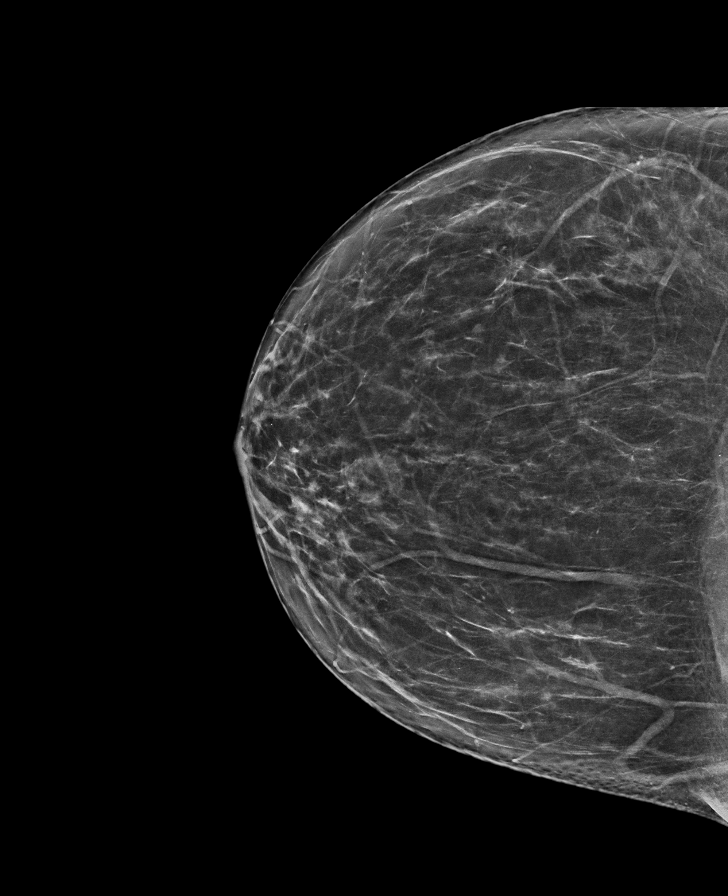

[R MLO synth-2D]
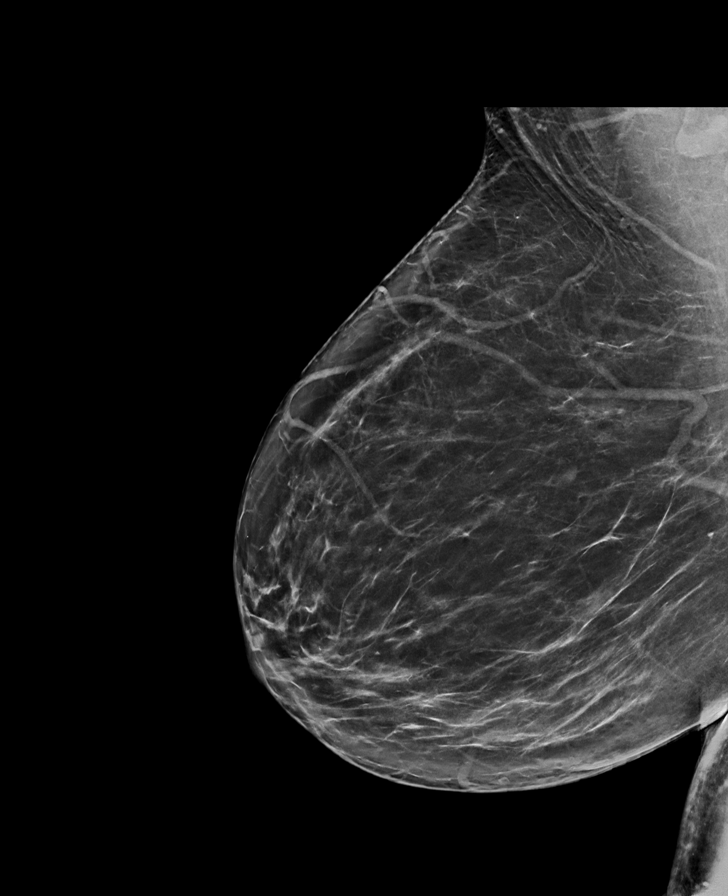

[L MLO synth-2D]
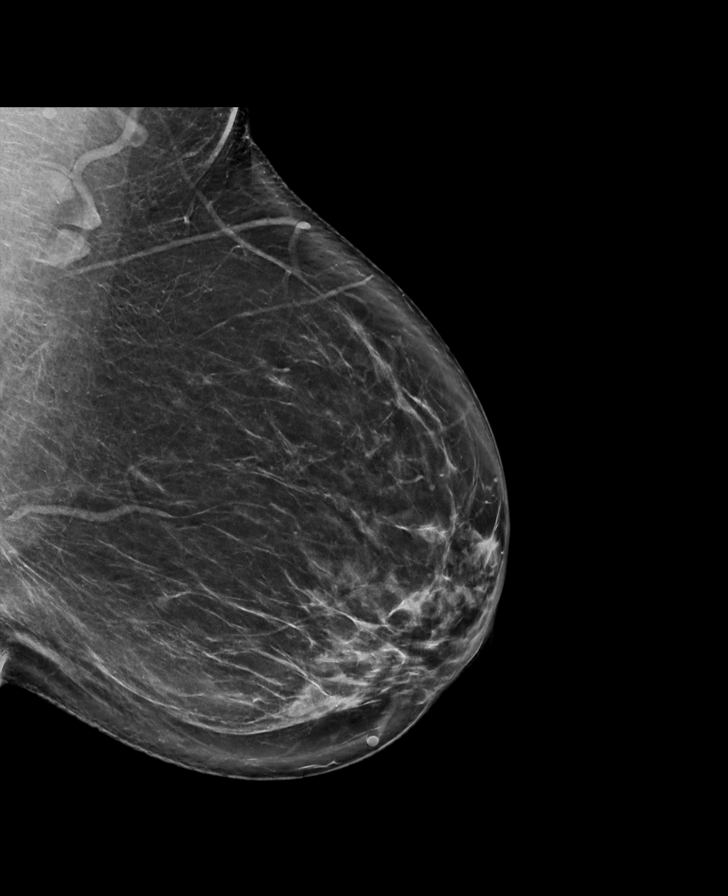

[R MLO tomo · tomo slice 43/86.0]
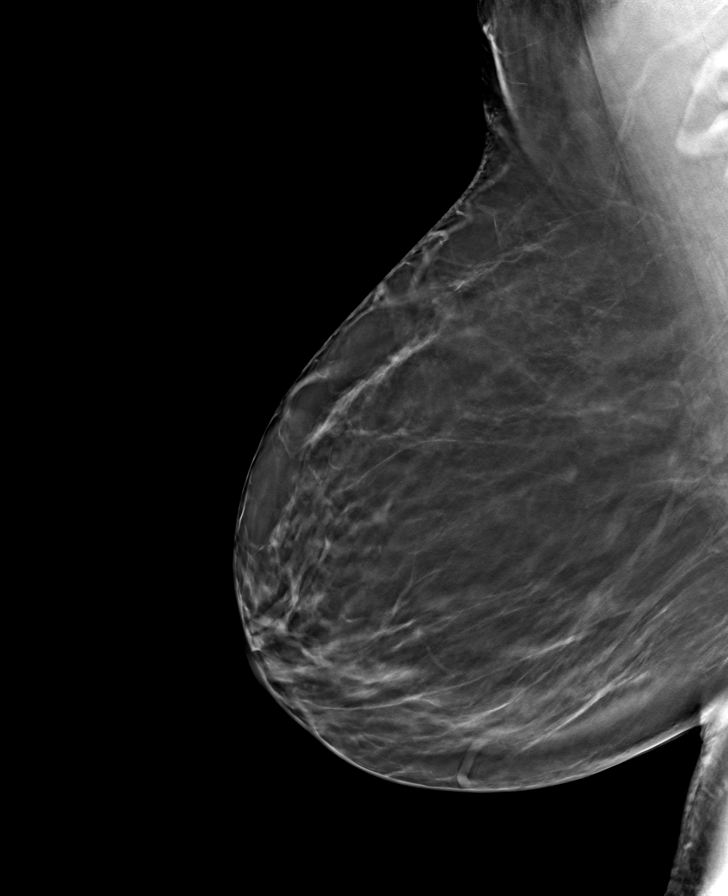

[L MLO tomo · tomo slice 45/88.0]
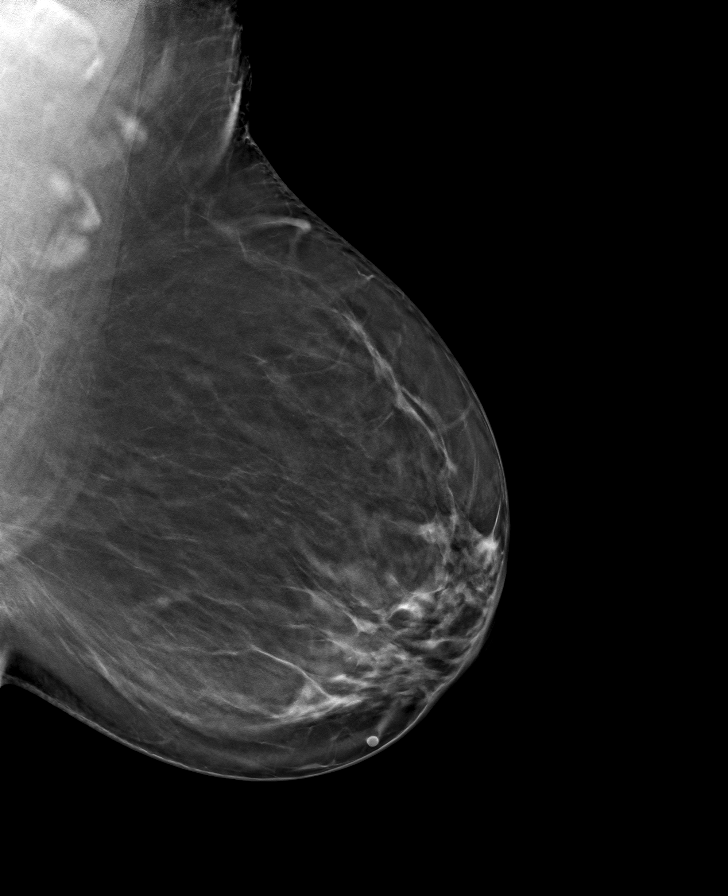

[L CC tomo · tomo slice 37/72.0]
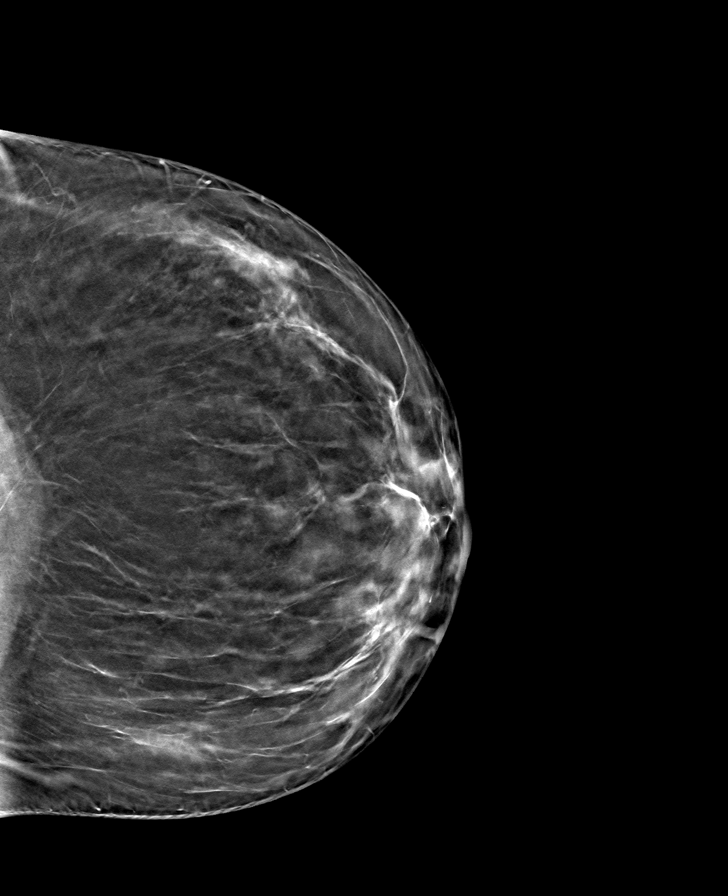

[R CC tomo · tomo slice 39/76.0]
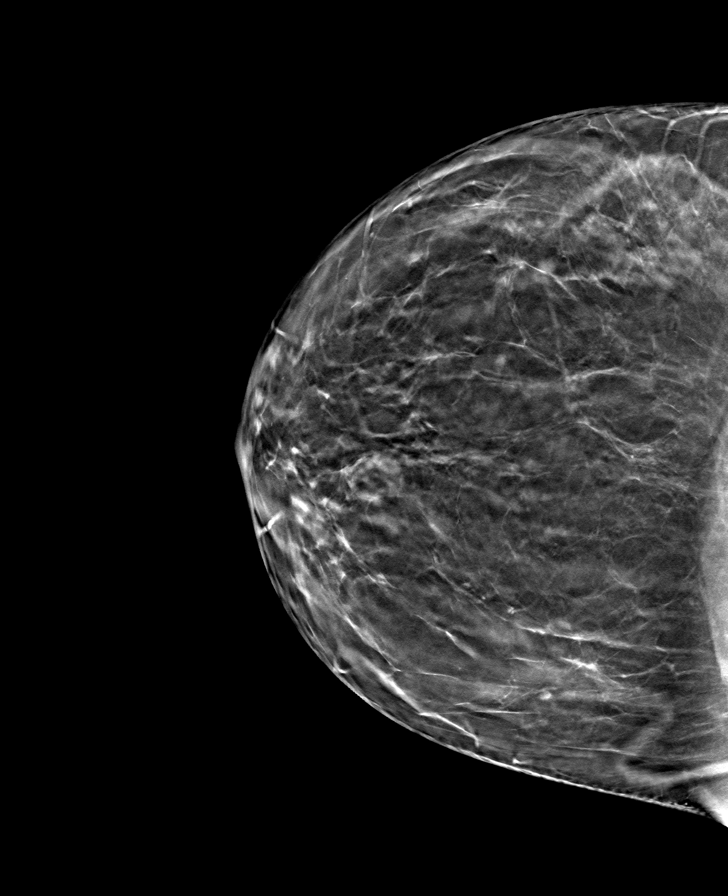

[8 of 24 positions shown; findings below may reference images not displayed]

ACR Breast Density Category b: There are scattered areas of
fibroglandular density.
FINDINGS: There are no findings suspicious for malignancy.
IMPRESSION: No mammographic evidence of malignancy. A result letter of this
screening mammogram will be mailed directly to the patient.

RECOMMENDATION:
Screening mammogram in one year. (Code:51-O-LD2)

BI-RADS CATEGORY  1: Negative.

## 2023-05-08 ENCOUNTER — Other Ambulatory Visit (HOSPITAL_BASED_OUTPATIENT_CLINIC_OR_DEPARTMENT_OTHER): Payer: Self-pay

## 2023-05-08 ENCOUNTER — Other Ambulatory Visit: Payer: Self-pay

## 2023-05-08 MED ORDER — OLMESARTAN MEDOXOMIL-HCTZ 20-12.5 MG PO TABS
1.0000 | ORAL_TABLET | Freq: Every day | ORAL | 3 refills | Status: DC
  Filled 2023-05-08: qty 30, 30d supply, fill #0
  Filled 2023-06-02: qty 30, 30d supply, fill #1
  Filled 2023-06-30: qty 30, 30d supply, fill #2
  Filled 2023-07-28: qty 30, 30d supply, fill #3
  Filled 2023-08-29: qty 30, 30d supply, fill #4
  Filled 2023-09-24: qty 30, 30d supply, fill #5
  Filled 2023-10-23: qty 30, 30d supply, fill #6
  Filled 2023-11-20: qty 30, 30d supply, fill #7
  Filled 2023-12-16: qty 30, 30d supply, fill #8
  Filled 2024-01-13: qty 30, 30d supply, fill #9
  Filled 2024-02-22: qty 30, 30d supply, fill #10
  Filled 2024-03-22: qty 30, 30d supply, fill #11

## 2023-05-08 MED ORDER — ROSUVASTATIN CALCIUM 5 MG PO TABS
5.0000 mg | ORAL_TABLET | ORAL | 3 refills | Status: DC
  Filled 2023-05-08: qty 8, 28d supply, fill #0
  Filled 2023-06-02: qty 8, 28d supply, fill #1
  Filled 2023-06-30: qty 8, 28d supply, fill #2
  Filled 2023-07-28: qty 8, 28d supply, fill #3
  Filled 2023-08-29: qty 8, 28d supply, fill #4
  Filled 2023-09-24: qty 8, 28d supply, fill #5
  Filled 2023-10-23: qty 8, 28d supply, fill #6
  Filled 2023-11-20: qty 8, 28d supply, fill #7
  Filled 2023-12-16: qty 8, 28d supply, fill #8
  Filled 2024-01-13: qty 8, 28d supply, fill #9
  Filled 2024-02-22: qty 8, 28d supply, fill #10
  Filled 2024-03-22: qty 8, 28d supply, fill #11

## 2023-07-10 ENCOUNTER — Encounter: Payer: Self-pay | Admitting: Obstetrics and Gynecology

## 2023-07-10 ENCOUNTER — Ambulatory Visit: Payer: 59 | Admitting: Obstetrics and Gynecology

## 2023-07-10 VITALS — BP 111/76 | HR 81 | Ht 64.75 in | Wt 189.0 lb

## 2023-07-10 DIAGNOSIS — R350 Frequency of micturition: Secondary | ICD-10-CM | POA: Diagnosis not present

## 2023-07-10 DIAGNOSIS — N993 Prolapse of vaginal vault after hysterectomy: Secondary | ICD-10-CM | POA: Diagnosis not present

## 2023-07-10 LAB — POCT URINALYSIS DIPSTICK
Bilirubin, UA: NEGATIVE
Blood, UA: NEGATIVE
Glucose, UA: NEGATIVE
Ketones, UA: NEGATIVE
Leukocytes, UA: NEGATIVE
Nitrite, UA: NEGATIVE
Protein, UA: NEGATIVE
Spec Grav, UA: 1.025 (ref 1.010–1.025)
Urobilinogen, UA: 0.2 U/dL
pH, UA: 6 (ref 5.0–8.0)

## 2023-07-10 NOTE — Patient Instructions (Signed)

## 2023-07-10 NOTE — Progress Notes (Signed)
New Patient Evaluation and Consultation  Referring Provider: Sherian Rein, * PCP: [name], MD Date of Service: 07/10/2023  SUBJECTIVE Chief Complaint: New Patient (Initial Visit) (Denise Rogers is a 58 y.o. female here for a consult for prolapse./)  History of Present Illness: Denise Rogers is a 58 y.o. Black or African-American female seen in consultation at the request of Dr Hinton Rao for evaluation of prolapse.    Review of records significant for: Has had hysterectomy. Prolapse to introitus at rest.   Urinary Symptoms: Does not leak urine.   Day time voids 10- sometimes has urgency.  Nocturia: 1 times per night to void. Voiding dysfunction:  does not empty bladder well.  Patient does not use a catheter to empty bladder.  When urinating, patient feels a weak stream and difficulty starting urine stream Drinks: 4 bottles water, 1 glass tea per day  UTIs:  0  UTI's in the last year.   Denies history of blood in urine and kidney or bladder stones   Pelvic Organ Prolapse Symptoms:                  Patient Admits to a feeling of a bulge the vaginal area. It has been present for 7 months.  Patient Admits to seeing a bulge.  This bulge is bothersome. Feels it when she is at work.    Bowel Symptom: Bowel movements: 3-4 time(s) per day Stool consistency: soft  Straining: yes.  Splinting: no.  Incomplete evacuation: no.  Patient Denies accidental bowel leakage / fecal incontinence Bowel regimen: stool softener  HM Colonoscopy          Upcoming     Colonoscopy (Every 10 Years) Next due on 04/13/2030    04/13/2020  COLONOSCOPY   Only the first 1 history entries have been loaded, but more history exists.                Sexual Function Sexually active: no.  Sexual orientation:  heterosexual Pain with sex: No  Pelvic Pain Denies pelvic pain   Past Medical History:  Past Medical History:  Diagnosis Date   Anemia    IRON  DEFICIENCY ANEMIA   Arthritis    shoulder; BOTH KNEES   Depression    hx of   Diabetes mellitus without complication (HCC)    on meds   Hyperlipidemia    on meds   Hypertension    on meds   Vitamin D deficiency    per PCP   Reports that blood sugars have been controlled since starting ozempic  Past Surgical History:   Past Surgical History:  Procedure Laterality Date   CARPAL TUNNEL REPAIR Right 06/30/2012   DILATION AND CURETTAGE OF UTERUS  06/18/1990   ROBOTIC ASSISTED TOTAL HYSTERECTOMY  05/07/2011   TOTAL KNEE ARTHROPLASTY Left 02/08/2014   Procedure: LEFT TOTAL KNEE ARTHROPLASTY;  Surgeon: Loanne Drilling, MD;  Location: [facility];  Service: Orthopedics;  Laterality: Left;     Past OB/GYN History: OB History  Gravida Para Term Preterm AB Living  0 0 0 0 0 0  SAB IAB Ectopic Multiple Live Births  0 0 0 0 0   S/p hysterectomy  Medications: Patient has a current medication list which includes the following prescription(s): aspirin, multiple vitamin, olmesartan-hydrochlorothiazide, rosuvastatin, and ozempic (2 mg/dose).   Allergies: Patient is allergic to lisinopril.   Social History:  Social History   Tobacco Use   Smoking status: Never   Smokeless tobacco: Never  Vaping Use   Vaping status: Never Used  Substance Use Topics   Alcohol use: No   Drug use: No    Relationship status: single Patient lives alone.   Patient is employed Leisure centre manager. Regular exercise: No History of abuse: No  Family History:   Family History  Problem Relation Age of Onset   Colon polyps Mother 52   Lung cancer Father 55   Colon cancer Neg Hx    Esophageal cancer Neg Hx    Stomach cancer Neg Hx    Rectal cancer Neg Hx      Review of Systems: Review of Systems  Constitutional:  Positive for weight loss. Negative for fever and malaise/fatigue.  Respiratory:  Negative for cough, shortness of breath and wheezing.   Cardiovascular:  Negative for chest pain,  palpitations and leg swelling.  Gastrointestinal:  Negative for abdominal pain and blood in stool.  Genitourinary:  Negative for dysuria.  Musculoskeletal:  Negative for myalgias.  Skin:  Negative for rash.  Neurological:  Negative for dizziness and headaches.  Endo/Heme/Allergies:  Does not bruise/bleed easily.  Psychiatric/Behavioral:  Negative for depression. The patient is not nervous/anxious.      OBJECTIVE Physical Exam: Vitals:   07/10/23 1001  BP: 111/76  Pulse: 81  Weight: 189 lb (85.7 kg)  Height: 5' 4.75" (1.645 m)    Physical Exam Vitals reviewed. Exam conducted with a chaperone present.  Constitutional:      General: She is not in acute distress. Pulmonary:     Effort: Pulmonary effort is normal.  Abdominal:     General: There is no distension.     Palpations: Abdomen is soft.     Tenderness: There is no abdominal tenderness. There is no rebound.  Musculoskeletal:        General: No swelling. Normal range of motion.  Skin:    General: Skin is warm and dry.     Findings: No rash.  Neurological:     Mental Status: She is alert and oriented to person, place, and time.  Psychiatric:        Mood and Affect: Mood normal.        Behavior: Behavior normal.      GU / Detailed Urogynecologic Evaluation:  Pelvic Exam: Normal external female genitalia; Bartholin's and Skene's glands normal in appearance; urethral meatus normal in appearance, no urethral masses or discharge.   CST: negative  s/p hysterectomy: Vaginal cuff everted. No lesions on vaginal mucosa.  Adnexa normal adnexa on bimanual    Pelvic floor strength I/V  Pelvic floor musculature: Right levator non-tender, Right obturator non-tender, Left levator non-tender, Left obturator non-tender  POP-Q:   POP-Q  4                                            Aa   4                                           Ba  4                                              C  6                                             Gh  3                                            Pb  8                                            tvl   4                                            Ap  4                                            Bp                                                 D      Rectal Exam:  Normal external rectum  Post-Void Residual (PVR) by Bladder Scan: In order to evaluate bladder emptying, we discussed obtaining a postvoid residual and patient agreed to this procedure.  Procedure: The ultrasound unit was placed on the patient's abdomen in the suprapubic region after the patient had voided.    Post Void Residual - 07/10/23 1020       Post Void Residual   Post Void Residual 2 mL              Laboratory Results: Lab Results  Component Value Date   COLORU Yellow 07/10/2023   CLARITYU Clear 07/10/2023   GLUCOSEUR Negative 07/10/2023   BILIRUBINUR Negative 07/10/2023   KETONESU Negative 07/10/2023   SPECGRAV 1.025 07/10/2023   RBCUR Negative 07/10/2023   PHUR 6.0 07/10/2023   PROTEINUR Negative 07/10/2023   UROBILINOGEN 0.2 07/10/2023   LEUKOCYTESUR Negative 07/10/2023    Lab Results  Component Value Date   CREATININE 1.33 (H) 06/12/2022   CREATININE 0.87 02/10/2014   CREATININE 0.75 02/09/2014    No results found for: "HGBA1C"  Lab Results  Component Value Date   HGB 16.7 (H) 06/12/2022     ASSESSMENT AND PLAN Ms. Bocock is a 58 y.o. with:  1. Vaginal vault prolapse after hysterectomy   2. Urinary frequency    - Stage III anterior, Stage III posterior, Stage III apical prolapse - For treatment of pelvic organ prolapse, we discussed options for management including expectant management, conservative management, and surgical management, such as Kegels, a pessary, pelvic floor physical therapy, and specific surgical procedures. - We discussed two options for prolapse repair:  1) vaginal repair without mesh - Pros - safer, no mesh complications - Cons  - not as strong as mesh repair, higher risk of recurrence  2) laparoscopic repair with mesh - Pros -  stronger, better long-term success - Cons - risks of mesh implant (erosion into vagina or bladder, adhering to the rectum, pain) - these risks are lower than with a vaginal mesh but still exist - She prefers robotic surgery with mesh. Will plan for: Robotic assisted sacrocolpopexy, perineorrhaphy, cystoscopy, possible posterior repair - Will have her undergo urodynamic testing to assess for occult incontinence.  - Plan to follow up with me for pre op after urodynamic testing.   Marguerita Beards, MD

## 2023-07-17 ENCOUNTER — Ambulatory Visit (INDEPENDENT_AMBULATORY_CARE_PROVIDER_SITE_OTHER): Payer: 59 | Admitting: Obstetrics and Gynecology

## 2023-07-17 ENCOUNTER — Encounter: Payer: Self-pay | Admitting: Obstetrics and Gynecology

## 2023-07-17 VITALS — BP 116/72 | HR 72

## 2023-07-17 DIAGNOSIS — N3281 Overactive bladder: Secondary | ICD-10-CM

## 2023-07-17 DIAGNOSIS — R350 Frequency of micturition: Secondary | ICD-10-CM

## 2023-07-17 DIAGNOSIS — N393 Stress incontinence (female) (male): Secondary | ICD-10-CM | POA: Diagnosis not present

## 2023-07-17 DIAGNOSIS — N993 Prolapse of vaginal vault after hysterectomy: Secondary | ICD-10-CM

## 2023-07-17 LAB — POCT URINALYSIS DIPSTICK
Bilirubin, UA: NEGATIVE
Blood, UA: NEGATIVE
Glucose, UA: NEGATIVE
Ketones, UA: NEGATIVE
Leukocytes, UA: NEGATIVE
Nitrite, UA: NEGATIVE
Protein, UA: NEGATIVE
Spec Grav, UA: 1.01 (ref 1.010–1.025)
Urobilinogen, UA: 0.2 U/dL
pH, UA: 7 (ref 5.0–8.0)

## 2023-07-17 NOTE — Progress Notes (Signed)
Denise Rogers  Referring Physician: [name], MD Date of Rogers: 07/17/2023  Denise Rogers is a 58 y.o. female who presents for urodynamic evaluation. Indication(s) for study: occult SUI and SUI  Vital Signs: BP 116/72   Pulse 72   LMP 04/19/2011   Laboratory Results: A catheterized urine specimen revealed:  Lab Results  Component Value Date   COLORU yellow 07/17/2023   CLARITYU clear 07/17/2023   GLUCOSEUR Negative 07/17/2023   BILIRUBINUR negative 07/17/2023   KETONESU negative 07/17/2023   SPECGRAV 1.010 07/17/2023   RBCUR negative 07/17/2023   PHUR 7.0 07/17/2023   PROTEINUR Negative 07/17/2023   UROBILINOGEN 0.2 07/17/2023   LEUKOCYTESUR Negative 07/17/2023      Voiding Diary: Deferred   Rogers Timeout:  The correct patient was verified and the correct Rogers was verified. The patient was in the correct position and safety precautions were reviewed based on at the patient's history.  Urodynamic Rogers A 28F dual lumen urodynamics catheter was placed under sterile conditions into the patient's bladder. A 28F catheter was placed into the rectum in order to measure abdominal pressure. EMG patches were placed in the appropriate position.  All connections were confirmed and calibrations/adjusted made. Saline was instilled into the bladder through the dual lumen catheters.  Cough/valsalva pressures were measured periodically during filling.  Patient was allowed to void.  The bladder was then emptied of its residual.  UROFLOW: Revealed a Qmax of 8.4 mL/sec.  She voided 124 mL and had a residual of 120 mL.  It was a intermittent pattern and represented normal habits.  CMG: This was performed with sterile water in the sitting position at a fill rate of 30 mL/min.    First sensation of fullness was 141 mLs,  First urge was 265 mLs,  Strong urge was 380 mLs and  Capacity was 899 mLs  Stress incontinence was  demonstrated Highest positive Barrier CLPP was 102 cmH20 at 400 ml. Highest positive Barrier VLPP was 112 cmH20 at 400 ml.  Detrusor function was overactive, with phasic contractions seen.  The first occurred at 51 mL to 7.6 cm of water and was not associated with urge.  Compliance:  Normal. End fill detrusor pressure was 10.5cmH20.  Calculated compliance was 3mL/cmH20  UPP: MUCP with barrier reduction was 79 cm of water.    MICTURITION STUDY: Voiding was performed with reduction using scopettes in the sitting position.  Pdet at Qmax was 27.5 cm of water.  Qmax was 39 mL/sec.  It was a normal pattern.  She voided 849 mL and had a residual of 50 mL.  It was a volitional void, sustained detrusor contraction was present and abdominal straining was not present  EMG: This was performed with patches.  She had voluntary contractions, recruitment with fill was present and urethral sphincter was not relaxed with void.  The details of the Rogers with the study tracings have been scanned into EPIC.   Urodynamic Impression:  1. Sensation was normal; capacity was increased 2. Stress Incontinence was demonstrated at normal pressures; 3. Detrusor Overactivity was demonstrated without leakage. 4. Emptying was normal with a normal PVR, a sustained detrusor contraction present,  abdominal straining not present, normal urethral sphincter activity on EMG.  Plan: - The patient will follow up  to discuss the findings and treatment options.

## 2023-07-24 ENCOUNTER — Ambulatory Visit: Payer: 59 | Admitting: Obstetrics and Gynecology

## 2023-08-02 ENCOUNTER — Ambulatory Visit: Payer: 59 | Admitting: Obstetrics and Gynecology

## 2023-08-02 ENCOUNTER — Other Ambulatory Visit (HOSPITAL_BASED_OUTPATIENT_CLINIC_OR_DEPARTMENT_OTHER): Payer: Self-pay

## 2023-08-02 ENCOUNTER — Encounter: Payer: Self-pay | Admitting: Obstetrics and Gynecology

## 2023-08-02 VITALS — BP 96/68 | HR 88 | Ht 64.75 in | Wt 185.0 lb

## 2023-08-02 DIAGNOSIS — N393 Stress incontinence (female) (male): Secondary | ICD-10-CM | POA: Diagnosis not present

## 2023-08-02 DIAGNOSIS — N3281 Overactive bladder: Secondary | ICD-10-CM

## 2023-08-02 DIAGNOSIS — N813 Complete uterovaginal prolapse: Secondary | ICD-10-CM | POA: Diagnosis not present

## 2023-08-02 DIAGNOSIS — N993 Prolapse of vaginal vault after hysterectomy: Secondary | ICD-10-CM

## 2023-08-02 MED ORDER — IBUPROFEN 600 MG PO TABS
600.0000 mg | ORAL_TABLET | Freq: Four times a day (QID) | ORAL | 0 refills | Status: DC | PRN
  Filled 2023-08-02: qty 30, 8d supply, fill #0

## 2023-08-02 MED ORDER — OXYCODONE HCL 5 MG PO TABS
5.0000 mg | ORAL_TABLET | ORAL | 0 refills | Status: DC | PRN
  Filled 2023-08-02: qty 15, 3d supply, fill #0

## 2023-08-02 MED ORDER — ACETAMINOPHEN 500 MG PO TABS
500.0000 mg | ORAL_TABLET | Freq: Four times a day (QID) | ORAL | 0 refills | Status: DC | PRN
Start: 2023-08-02 — End: 2023-10-09
  Filled 2023-08-02: qty 30, 8d supply, fill #0

## 2023-08-02 MED ORDER — POLYETHYLENE GLYCOL 3350 17 GM/SCOOP PO POWD
17.0000 g | Freq: Every day | ORAL | 0 refills | Status: AC
Start: 2023-08-02 — End: ?
  Filled 2023-08-02: qty 255, 15d supply, fill #0

## 2023-08-02 MED ORDER — ESTRADIOL 0.1 MG/GM VA CREA
TOPICAL_CREAM | VAGINAL | 11 refills | Status: AC
  Filled 2023-08-02: qty 42.5, 30d supply, fill #0
  Filled 2023-10-23: qty 42.5, 30d supply, fill #1

## 2023-08-02 NOTE — H&P (Signed)
[facility] Pre Op H&P  Subjective Chief Complaint: Denise Rogers presents for follow up after urodynamic testing  History of Present Illness: Denise Rogers is a 58 y.o. female with Stage III anterior, Stage III posterior, Stage III apical prolapse. She is scheduled to undergo Exam under anesthesia, Robotic assisted sacrocolpopexy, perineorrhaphy, midurethral sling, cystoscopy on 08/26/23. She has symptoms of vaginal bulge and UDS showed SUI.   Urodynamic Impression:  1. Sensation was normal; capacity was increased 2. Stress Incontinence was demonstrated at normal pressures; 3. Detrusor Overactivity was demonstrated without leakage. 4. Emptying was normal with a normal PVR, a sustained detrusor contraction present,  abdominal straining not present, normal urethral sphincter activity on EMG.  Past Medical History:  Diagnosis Date   Anemia    IRON DEFICIENCY ANEMIA   Arthritis    shoulder; BOTH KNEES   Depression    hx of   Diabetes mellitus without complication (HCC)    on meds   Hyperlipidemia    on meds   Hypertension    on meds   Vitamin D deficiency    per PCP     Past Surgical History:  Procedure Laterality Date   CARPAL TUNNEL REPAIR Right 06/30/2012   DILATION AND CURETTAGE OF UTERUS  06/18/1990   ROBOTIC ASSISTED TOTAL HYSTERECTOMY  05/07/2011   TOTAL KNEE ARTHROPLASTY Left 02/08/2014   Procedure: LEFT TOTAL KNEE ARTHROPLASTY;  Surgeon: Loanne Drilling, MD;  Location: [facility];  Service: Orthopedics;  Laterality: Left;    is allergic to lisinopril.   Family History  Problem Relation Age of Onset   Colon polyps Mother 64   Lung cancer Father 33   Colon cancer Neg Hx    Esophageal cancer Neg Hx    Stomach cancer Neg Hx    Rectal cancer Neg Hx     Social History   Tobacco Use   Smoking status: Never   Smokeless tobacco: Never  Vaping Use   Vaping status: Never Used  Substance Use Topics   Alcohol use: No   Drug use: No      Review of Systems was negative for a full 10 system review except as noted in the History of Present Illness.  No current facility-administered medications for this encounter.  Current Outpatient Medications:    acetaminophen (TYLENOL) 500 MG tablet, Take 1 tablet (500 mg total) by mouth every 6 (six) hours as needed (pain)., Disp: 30 tablet, Rfl: 0   aspirin 81 MG tablet, Take 81 mg by mouth daily., Disp: , Rfl:    [START ON 08/05/2023] estradiol (ESTRACE) 0.1 MG/GM vaginal cream, Place 0.5g nightly for two weeks then twice a week after, Disp: 30 g, Rfl: 11   ibuprofen (ADVIL) 600 MG tablet, Take 1 tablet (600 mg total) by mouth every 6 (six) hours as needed., Disp: 30 tablet, Rfl: 0   Multiple Vitamins-Minerals (MULTIVITAMIN ADULT PO), Take 1 tablet by mouth daily., Disp: , Rfl:    olmesartan-hydrochlorothiazide (BENICAR HCT) 20-12.5 MG tablet, Take 1 tablet by mouth daily., Disp: 90 tablet, Rfl: 3   oxyCODONE (OXY IR/ROXICODONE) 5 MG immediate release tablet, Take 1 tablet (5 mg total) by mouth every 4 (four) hours as needed for severe pain (pain score 7-10)., Disp: 15 tablet, Rfl: 0   polyethylene glycol powder (GLYCOLAX/MIRALAX) 17 GM/SCOOP powder, Take 17 g by mouth daily. Drink 17g (1 scoop) dissolved in water per day., Disp: 255 g, Rfl: 0   rosuvastatin (CRESTOR) 5 MG tablet, Take 1  tablet (5 mg total) by mouth 2 (two) times a week., Disp: 24 tablet, Rfl: 3   Semaglutide, 2 MG/DOSE, (OZEMPIC, 2 MG/DOSE,) 8 MG/3ML SOPN, Inject 2 mg into the skin once a week., Disp: 3 mL, Rfl: 11   Objective There were no vitals filed for this visit.   Gen: NAD CV: S1 S2 RRR Lungs: Clear to auscultation bilaterally Abd: soft, nontender   Previous Pelvic Exam showed: POP-Q   3                                           Aa   4                                           Ba   4                                              C    6                                            Gh   3                                             Pb   8                                            tvl    3                                           Ap   4                                            Bp                                                  D         Assessment/ Plan  The patient is a 58 y.o. year old with stage III POP and SUI scheduled to undergo Exam under anesthesia, Robotic assisted sacrocolpopexy, perineorrhaphy, midurethral sling, cystoscopy.     Marguerita Beards, MD

## 2023-08-02 NOTE — Progress Notes (Signed)
[facility] Return visit  Subjective Chief Complaint: Denise Rogers presents for follow up after urodynamic testing  History of Present Illness: Denise Rogers is a 58 y.o. female with Stage III anterior, Stage III posterior, Stage III apical prolapse.   Urodynamic Impression:  1. Sensation was normal; capacity was increased 2. Stress Incontinence was demonstrated at normal pressures; 3. Detrusor Overactivity was demonstrated without leakage. 4. Emptying was normal with a normal PVR, a sustained detrusor contraction present,  abdominal straining not present, normal urethral sphincter activity on EMG.  Past Medical History:  Diagnosis Date   Anemia    IRON DEFICIENCY ANEMIA   Arthritis    shoulder; BOTH KNEES   Depression    hx of   Diabetes mellitus without complication (HCC)    on meds   Hyperlipidemia    on meds   Hypertension    on meds   Vitamin D deficiency    per PCP     Past Surgical History:  Procedure Laterality Date   CARPAL TUNNEL REPAIR Right 06/30/2012   DILATION AND CURETTAGE OF UTERUS  06/18/1990   ROBOTIC ASSISTED TOTAL HYSTERECTOMY  05/07/2011   TOTAL KNEE ARTHROPLASTY Left 02/08/2014   Procedure: LEFT TOTAL KNEE ARTHROPLASTY;  Surgeon: Loanne Drilling, MD;  Location: [facility];  Service: Orthopedics;  Laterality: Left;    is allergic to lisinopril.   Family History  Problem Relation Age of Onset   Colon polyps Mother 19   Lung cancer Father 44   Colon cancer Neg Hx    Esophageal cancer Neg Hx    Stomach cancer Neg Hx    Rectal cancer Neg Hx     Social History   Tobacco Use   Smoking status: Never   Smokeless tobacco: Never  Vaping Use   Vaping status: Never Used  Substance Use Topics   Alcohol use: No   Drug use: No     Review of Systems was negative for a full 10 system review except as noted in the History of Present Illness.   Current Outpatient Medications:    aspirin 81 MG tablet, Take 81 mg by mouth  daily., Disp: , Rfl:    Multiple Vitamins-Minerals (MULTIVITAMIN ADULT PO), Take 1 tablet by mouth daily., Disp: , Rfl:    olmesartan-hydrochlorothiazide (BENICAR HCT) 20-12.5 MG tablet, Take 1 tablet by mouth daily., Disp: 90 tablet, Rfl: 3   rosuvastatin (CRESTOR) 5 MG tablet, Take 1 tablet (5 mg total) by mouth 2 (two) times a week., Disp: 24 tablet, Rfl: 3   Semaglutide, 2 MG/DOSE, (OZEMPIC, 2 MG/DOSE,) 8 MG/3ML SOPN, Inject 2 mg into the skin once a week., Disp: 3 mL, Rfl: 11   Objective Vitals:   08/02/23 0755  BP: 96/68  Pulse: 88  SpO2: 96%    Gen: NAD CV: S1 S2 RRR Lungs: Clear to auscultation bilaterally Abd: soft, nontender   Previous Pelvic Exam showed: POP-Q   3                                           Aa   4                                           Ba   4  C    6                                            Gh   3                                            Pb   8                                            tvl    3                                           Ap   4                                            Bp                                                  D         Assessment/ Plan  Assessment: The patient is a 58 y.o. year old scheduled to undergo Exam under anesthesia, Robotic assisted sacrocolpopexy, perineorrhaphy, midurethral sling, cystoscopy.   Plan: General Surgical Consent: The patient has previously been counseled on alternative treatments, and the decision by the patient and provider was to proceed with the procedure listed above.  For all procedures, there are risks of bleeding, infection, damage to surrounding organs including but not limited to bowel, bladder, blood vessels, ureters and nerves, and need for further surgery if an injury were to occur. These risks are all low with minimally invasive surgery.   There are risks of numbness and weakness at any body site or buttock/rectal  pain.  It is possible that baseline pain can be worsened by surgery, either with or without mesh. If surgery is vaginal, there is also a low risk of possible conversion to laparoscopy or open abdominal incision where indicated. Very rare risks include blood transfusion, blood clot, heart attack, pneumonia, or death.   There is also a risk of short-term postoperative urinary retention with need to use a catheter. About half of patients need to go home from surgery with a catheter, which is then later removed in the office. The risk of long-term need for a catheter is very low. There is also a risk of worsening of overactive bladder.   Sling: The effectiveness of a midurethral vaginal mesh sling is approximately 85%, and thus, there will be times when you may leak urine after surgery, especially if your bladder is full or if you have a strong cough. There is a balance between making the sling tight enough to treat your leakage but not too tight so that you have long-term difficulty emptying your bladder. A mesh sling will not directly treat overactive  bladder/urge incontinence and may worsen it.  There is an FDA safety notification on vaginal mesh procedures for prolapse but NOT mesh slings. We have extensive experience and training with mesh placement and we have close postoperative follow up to identify any potential complications from mesh. It is important to realize that this mesh is a permanent implant that cannot be easily removed. There are rare risks of mesh exposure (2-4%), pain with intercourse (0-7%), and infection (<1%). The risk of mesh exposure if more likely in a woman with risks for poor healing (prior radiation, poorly controlled diabetes, or immunocompromised). The risk of new or worsened chronic pain after mesh implant is more common in women with baseline chronic pain and/or poorly controlled anxiety or depression. Approximately 2-4% of patients will experience longer-term post-operative  voiding dysfunction that may require surgical revision of the sling. We also reviewed that postoperatively, her stream may not be as strong as before surgery.    Prolapse (with or without mesh): Risk factors for surgical failure  include things that put pressure on your pelvis and the surgical repair, including obesity, chronic cough, and heavy lifting or straining (including lifting children or adults, straining on the toilet, or lifting heavy objects such as furniture or anything weighing >25 lbs. Risks of recurrence is 20-30% with vaginal native tissue repair and a less than 10% with sacrocolpopexy with mesh.    Sacrocolpopexy: Mesh implants may provide more prolapse support, but do have some unique risks to consider. It is important to understand that mesh is permanent and cannot be easily removed. Risks of abdominal sacrocolpopexy mesh include mesh exposure (~3-6%), painful intercourse (recent studies show lower rates after surgery compared to before, with ~5-8% risk of new onset), and very rare risks of bowel or bladder injury or infection (<1%). The risk of mesh exposure is more likely in a woman with risks for poor healing (prior radiation, poorly controlled diabetes, or immunocompromised). The risk of new or worsened chronic pain after mesh implant is more common in women with baseline chronic pain and/or poorly controlled anxiety or depression. There is an FDA safety notification on vaginal mesh procedures for prolapse but NOT abdominal mesh procedures and therefore does not apply to your surgery. We have extensive experience and training with mesh placement and we have close postoperative follow up to identify any potential complications from mesh.    We discussed consent for blood products. Risks for blood transfusion include allergic reactions, other reactions that can affect different body organs and managed accordingly, transmission of infectious diseases such as HIV or Hepatitis. However, the  blood is screened. Patient consents for blood products.  Pre-operative instructions:  She was instructed to not take Aspirin/NSAIDs x 7days prior to surgery. S Antibiotic prophylaxis was ordered as indicated.  Catheter use: Patient will go home with foley if needed after post-operative voiding trial.  Post-operative instructions:  She was provided with specific post-operative instructions, including precautions and signs/symptoms for which we would recommend contacting us, in addition to daytime and after-hours contact phone numbers. This was provided on a handout.   Post-operative medications: Prescriptions for motrin, tylenol, miralax, oxycodone, and vaginal estrogen were sent to her pharmacy. Discussed using ibuprofen and tylenol on a schedule to limit use of narcotics.   Laboratory testing:  We will check labs: type and screen  Preoperative clearance:  She does not require surgical clearance.    Post-operative follow-up:  A post-operative appointment will be made for 6 weeks from the date of surgery.  If she needs a post-operative nurse visit for a voiding trial, that will be set up after she leaves the hospital.    Patient will call the clinic or use MyChart should anything change or any new issues arise.   Marguerita Beards, MD

## 2023-08-14 ENCOUNTER — Encounter (HOSPITAL_COMMUNITY): Payer: Self-pay | Admitting: Obstetrics and Gynecology

## 2023-08-14 NOTE — Progress Notes (Addendum)
 Spoke w/ via phone for pre-op interview--- Denise Rogers needs dos----  A1C and T&S per surgeon. EKG,CBG and BMP per anesthesia       Rogers results------ COVID test -----patient states asymptomatic no test needed Arrive at -------0530 NPO after MN NO Solid Food.  Clear liquids from MN until---0430 Pre-Surgery Ensure or G2:  Med rec completed Medications to take morning of surgery -----NONE Diabetic medication -----  GLP1 agonist last dose: 08/11/23-Ozempic GLP1 instructions: Hold any further Ozempic doses until after surgery, pt verbalized understanding.  Patient instructed no nail polish to be worn day of surgery Patient instructed to bring photo id and insurance card day of surgery Patient aware to have Driver (ride ) / caregiver    for 24 hours after surgery - Sister Denise Rogers Patient Special Instructions -----Shower with antibacterial soap. Pre-Op special Instructions -----  Patient verbalized understanding of instructions that were given at this phone interview. Patient denies chest pain, sob, fever, cough at the interview.

## 2023-08-19 NOTE — Progress Notes (Unsigned)
 Denise Rogers is a 58 y.o. female came in and dropped off a FLMA form to [employer] from "[employer] wage continuation plan" (No company name Pt notified there will be a 10-14 day turn around for forms to be ready and  she will be contacted as soon as the from are ready and have been faxed.  Pre-op date: 08/02/23 Surgery date: 08/26/23 Post op date:10/09/23

## 2023-08-25 NOTE — Anesthesia Preprocedure Evaluation (Signed)
 Anesthesia Evaluation  Patient identified by MRN, date of birth, ID band Patient awake    Reviewed: Allergy & Precautions, NPO status , Patient's Chart, lab work & pertinent test results  History of Anesthesia Complications Negative for: history of anesthetic complications  Airway Mallampati: II  TM Distance: >3 FB Neck ROM: Full    Dental  (+) Dental Advisory Given, Teeth Intact   Pulmonary neg pulmonary ROS   Pulmonary exam normal        Cardiovascular hypertension, Pt. on medications Normal cardiovascular exam     Neuro/Psych  PSYCHIATRIC DISORDERS  Depression    negative neurological ROS     GI/Hepatic negative GI ROS, Neg liver ROS,,,  Endo/Other  diabetes, Type 2   Obesity   Renal/GU negative Renal ROS  Female GU complaint     Musculoskeletal  (+) Arthritis ,    Abdominal   Peds  Hematology negative hematology ROS (+)   Anesthesia Other Findings On GLP-1a   Reproductive/Obstetrics  s/p hysterectomy                              Anesthesia Physical Anesthesia Plan  ASA: 2  Anesthesia Plan: General   Post-op Pain Management: Tylenol PO (pre-op)* and Celebrex PO (pre-op)*   Induction: Intravenous  PONV Risk Score and Plan: 3 and Treatment may vary due to age or medical condition, Ondansetron, Dexamethasone, Midazolam and Scopolamine patch - Pre-op  Airway Management Planned: Oral ETT  Additional Equipment: None  Intra-op Plan:   Post-operative Plan: Extubation in OR  Informed Consent: I have reviewed the patients History and Physical, chart, labs and discussed the procedure including the risks, benefits and alternatives for the proposed anesthesia with the patient or authorized representative who has indicated his/her understanding and acceptance.     Dental advisory given  Plan Discussed with: CRNA and Anesthesiologist  Anesthesia Plan Comments:         Anesthesia Quick Evaluation

## 2023-08-26 ENCOUNTER — Other Ambulatory Visit: Payer: Self-pay

## 2023-08-26 ENCOUNTER — Encounter (HOSPITAL_COMMUNITY)
Admission: RE | Disposition: A | Discharge status: Home / Self Care | Payer: Self-pay | Source: Home / Self Care | Attending: Obstetrics and Gynecology

## 2023-08-26 ENCOUNTER — Encounter (HOSPITAL_COMMUNITY): Payer: Self-pay | Admitting: Obstetrics and Gynecology

## 2023-08-26 ENCOUNTER — Ambulatory Visit (HOSPITAL_BASED_OUTPATIENT_CLINIC_OR_DEPARTMENT_OTHER): Admitting: Anesthesiology

## 2023-08-26 ENCOUNTER — Ambulatory Visit (HOSPITAL_COMMUNITY)
Admission: RE | Admit: 2023-08-26 | Discharge: 2023-08-26 | Disposition: A | Discharge status: Home / Self Care | Payer: 59 | Attending: Obstetrics and Gynecology | Admitting: Obstetrics and Gynecology

## 2023-08-26 ENCOUNTER — Ambulatory Visit (HOSPITAL_COMMUNITY): Admitting: Anesthesiology

## 2023-08-26 DIAGNOSIS — E119 Type 2 diabetes mellitus without complications: Secondary | ICD-10-CM

## 2023-08-26 DIAGNOSIS — Z79899 Other long term (current) drug therapy: Secondary | ICD-10-CM | POA: Diagnosis not present

## 2023-08-26 DIAGNOSIS — N393 Stress incontinence (female) (male): Secondary | ICD-10-CM

## 2023-08-26 DIAGNOSIS — N993 Prolapse of vaginal vault after hysterectomy: Secondary | ICD-10-CM

## 2023-08-26 DIAGNOSIS — Z90710 Acquired absence of both cervix and uterus: Secondary | ICD-10-CM | POA: Insufficient documentation

## 2023-08-26 DIAGNOSIS — N318 Other neuromuscular dysfunction of bladder: Secondary | ICD-10-CM | POA: Diagnosis not present

## 2023-08-26 DIAGNOSIS — M1990 Unspecified osteoarthritis, unspecified site: Secondary | ICD-10-CM | POA: Diagnosis not present

## 2023-08-26 DIAGNOSIS — Z7985 Long-term (current) use of injectable non-insulin antidiabetic drugs: Secondary | ICD-10-CM | POA: Diagnosis not present

## 2023-08-26 DIAGNOSIS — I10 Essential (primary) hypertension: Secondary | ICD-10-CM

## 2023-08-26 HISTORY — PX: PUBOVAGINAL SLING: SHX1035

## 2023-08-26 HISTORY — PX: EXAM UNDER ANESTHESIA, PELVIC: SHX7461

## 2023-08-26 HISTORY — PX: CYSTOSCOPY: SHX5120

## 2023-08-26 HISTORY — PX: ROBOTIC ASSISTED LAPAROSCOPIC SACROCOLPOPEXY: SHX5388

## 2023-08-26 HISTORY — PX: PERINEOPLASTY: SHX2218

## 2023-08-26 LAB — POCT I-STAT, CHEM 8
BUN: 18 mg/dL (ref 6–20)
Calcium, Ion: 1.18 mmol/L (ref 1.15–1.40)
Chloride: 108 mmol/L (ref 98–111)
Creatinine, Ser: 1.1 mg/dL — ABNORMAL HIGH (ref 0.44–1.00)
Glucose, Bld: 99 mg/dL (ref 70–99)
HCT: 35 % — ABNORMAL LOW (ref 36.0–46.0)
Hemoglobin: 11.9 g/dL — ABNORMAL LOW (ref 12.0–15.0)
Potassium: 4.1 mmol/L (ref 3.5–5.1)
Sodium: 141 mmol/L (ref 135–145)
TCO2: 24 mmol/L (ref 22–32)

## 2023-08-26 LAB — BASIC METABOLIC PANEL
Anion gap: 6 (ref 5–15)
BUN: 17 mg/dL (ref 6–20)
CO2: 24 mmol/L (ref 22–32)
Calcium: 8.8 mg/dL — ABNORMAL LOW (ref 8.9–10.3)
Chloride: 109 mmol/L (ref 98–111)
Creatinine, Ser: 1.17 mg/dL — ABNORMAL HIGH (ref 0.44–1.00)
GFR, Estimated: 54 mL/min — ABNORMAL LOW (ref >60)
Glucose, Bld: 101 mg/dL — ABNORMAL HIGH (ref 70–99)
Potassium: 4.1 mmol/L (ref 3.5–5.1)
Sodium: 139 mmol/L (ref 135–145)

## 2023-08-26 LAB — TYPE AND SCREEN
ABO/RH(D): A POS
Antibody Screen: NEGATIVE

## 2023-08-26 LAB — HEMOGLOBIN A1C
Hgb A1c MFr Bld: 5.6 % (ref 4.8–5.6)
Mean Plasma Glucose: 114.02 mg/dL

## 2023-08-26 SURGERY — SACROCOLPOPEXY, ROBOT-ASSISTED, LAPAROSCOPIC
Anesthesia: General | Site: Vagina

## 2023-08-26 MED ORDER — POVIDONE-IODINE 10 % EX SWAB: 2.0000 | Freq: Once | CUTANEOUS | Status: DC

## 2023-08-26 MED ORDER — SODIUM CHLORIDE 0.9 % IV SOLN: 12.5000 mg | INTRAVENOUS | Status: DC | PRN

## 2023-08-26 MED ORDER — CELECOXIB 200 MG PO CAPS
200.0000 mg | ORAL_CAPSULE | Freq: Once | ORAL | Status: AC
  Administered 2023-08-26: 200 mg via ORAL

## 2023-08-26 MED ORDER — SUGAMMADEX SODIUM 200 MG/2ML IV SOLN
INTRAVENOUS | Status: DC | PRN
  Administered 2023-08-26: 170 mg via INTRAVENOUS

## 2023-08-26 MED ORDER — CEFAZOLIN SODIUM-DEXTROSE 2-4 GM/100ML-% IV SOLN
2.0000 g | INTRAVENOUS | Status: AC
  Administered 2023-08-26: 2 g via INTRAVENOUS

## 2023-08-26 MED ORDER — DEXAMETHASONE SODIUM PHOSPHATE 10 MG/ML IJ SOLN
INTRAMUSCULAR | Status: AC
  Filled 2023-08-26: qty 1

## 2023-08-26 MED ORDER — OXYCODONE HCL 5 MG PO TABS: 5.0000 mg | ORAL_TABLET | Freq: Once | ORAL | Status: DC | PRN

## 2023-08-26 MED ORDER — ACETAMINOPHEN 500 MG PO TABS
1000.0000 mg | ORAL_TABLET | ORAL | Status: AC
  Administered 2023-08-26: 1000 mg via ORAL

## 2023-08-26 MED ORDER — LIDOCAINE 2% (20 MG/ML) 5 ML SYRINGE
INTRAMUSCULAR | Status: DC | PRN
  Administered 2023-08-26: 60 mg via INTRAVENOUS

## 2023-08-26 MED ORDER — ONDANSETRON HCL 4 MG/2ML IJ SOLN
INTRAMUSCULAR | Status: AC
  Filled 2023-08-26: qty 2

## 2023-08-26 MED ORDER — SCOPOLAMINE 1 MG/3DAYS TD PT72
1.0000 | MEDICATED_PATCH | TRANSDERMAL | Status: DC
  Administered 2023-08-26: 1.5 mg via TRANSDERMAL

## 2023-08-26 MED ORDER — CELECOXIB 200 MG PO CAPS
ORAL_CAPSULE | ORAL | Status: DC
Start: 2023-08-26 — End: 2023-08-26
  Filled 2023-08-26: qty 1

## 2023-08-26 MED ORDER — ROCURONIUM BROMIDE 10 MG/ML (PF) SYRINGE
PREFILLED_SYRINGE | INTRAVENOUS | Status: AC
  Filled 2023-08-26: qty 10

## 2023-08-26 MED ORDER — BUPIVACAINE HCL (PF) 0.25 % IJ SOLN
INTRAMUSCULAR | Status: AC
  Filled 2023-08-26: qty 30

## 2023-08-26 MED ORDER — CHLORHEXIDINE GLUCONATE 0.12 % MT SOLN
15.0000 mL | Freq: Once | OROMUCOSAL | Status: AC
  Administered 2023-08-26: 15 mL via OROMUCOSAL

## 2023-08-26 MED ORDER — AMISULPRIDE (ANTIEMETIC) 5 MG/2ML IV SOLN: 10.0000 mg | Freq: Once | INTRAVENOUS | Status: DC | PRN

## 2023-08-26 MED ORDER — ACETAMINOPHEN 500 MG PO TABS
ORAL_TABLET | ORAL | Status: AC
  Filled 2023-08-26: qty 2

## 2023-08-26 MED ORDER — SODIUM CHLORIDE 0.9 % IR SOLN
Status: DC | PRN
  Administered 2023-08-26: 1000 mL

## 2023-08-26 MED ORDER — PHENYLEPHRINE 80 MCG/ML (10ML) SYRINGE FOR IV PUSH (FOR BLOOD PRESSURE SUPPORT)
PREFILLED_SYRINGE | INTRAVENOUS | Status: DC | PRN
  Administered 2023-08-26 (×4): 80 ug via INTRAVENOUS

## 2023-08-26 MED ORDER — MIDAZOLAM HCL 2 MG/2ML IJ SOLN
INTRAMUSCULAR | Status: DC | PRN
  Administered 2023-08-26: 2 mg via INTRAVENOUS

## 2023-08-26 MED ORDER — STERILE WATER FOR IRRIGATION IR SOLN
Status: DC | PRN
  Administered 2023-08-26: 1000 mL

## 2023-08-26 MED ORDER — INSULIN ASPART 100 UNIT/ML IJ SOLN: 0.0000 [IU] | INTRAMUSCULAR | Status: DC | PRN

## 2023-08-26 MED ORDER — PROPOFOL 10 MG/ML IV BOLUS
INTRAVENOUS | Status: AC
  Filled 2023-08-26: qty 20

## 2023-08-26 MED ORDER — FENTANYL CITRATE (PF) 250 MCG/5ML IJ SOLN
INTRAMUSCULAR | Status: AC
  Filled 2023-08-26: qty 5

## 2023-08-26 MED ORDER — PHENAZOPYRIDINE HCL 100 MG PO TABS
200.0000 mg | ORAL_TABLET | ORAL | Status: AC
  Administered 2023-08-26: 200 mg via ORAL

## 2023-08-26 MED ORDER — FENTANYL CITRATE (PF) 250 MCG/5ML IJ SOLN
INTRAMUSCULAR | Status: DC | PRN
Start: 2023-08-26 — End: 2023-08-26
  Administered 2023-08-26 (×3): 50 ug via INTRAVENOUS

## 2023-08-26 MED ORDER — LIDOCAINE-EPINEPHRINE 1 %-1:100000 IJ SOLN
INTRAMUSCULAR | Status: AC
  Filled 2023-08-26: qty 2

## 2023-08-26 MED ORDER — SUGAMMADEX SODIUM 200 MG/2ML IV SOLN
INTRAVENOUS | Status: AC
  Filled 2023-08-26: qty 2

## 2023-08-26 MED ORDER — ROCURONIUM BROMIDE 10 MG/ML (PF) SYRINGE
PREFILLED_SYRINGE | INTRAVENOUS | Status: DC | PRN
  Administered 2023-08-26: 60 mg via INTRAVENOUS
  Administered 2023-08-26: 10 mg via INTRAVENOUS

## 2023-08-26 MED ORDER — LACTATED RINGERS IV SOLN
INTRAVENOUS | Status: DC
Start: 2023-08-26 — End: 2023-08-26

## 2023-08-26 MED ORDER — PROPOFOL 10 MG/ML IV BOLUS
INTRAVENOUS | Status: DC | PRN
  Administered 2023-08-26: 150 mg via INTRAVENOUS

## 2023-08-26 MED ORDER — CHLORHEXIDINE GLUCONATE 0.12 % MT SOLN
OROMUCOSAL | Status: AC
  Filled 2023-08-26: qty 15

## 2023-08-26 MED ORDER — DEXAMETHASONE SODIUM PHOSPHATE 10 MG/ML IJ SOLN
INTRAMUSCULAR | Status: DC | PRN
  Administered 2023-08-26: 5 mg via INTRAVENOUS

## 2023-08-26 MED ORDER — SCOPOLAMINE 1 MG/3DAYS TD PT72
MEDICATED_PATCH | TRANSDERMAL | Status: AC
  Filled 2023-08-26: qty 1

## 2023-08-26 MED ORDER — MIDAZOLAM HCL 2 MG/2ML IJ SOLN
INTRAMUSCULAR | Status: AC
  Filled 2023-08-26: qty 2

## 2023-08-26 MED ORDER — FENTANYL CITRATE (PF) 100 MCG/2ML IJ SOLN: 25.0000 ug | INTRAMUSCULAR | Status: DC | PRN

## 2023-08-26 MED ORDER — OXYCODONE HCL 5 MG/5ML PO SOLN: 5.0000 mg | Freq: Once | ORAL | Status: DC | PRN

## 2023-08-26 MED ORDER — PHENYLEPHRINE 80 MCG/ML (10ML) SYRINGE FOR IV PUSH (FOR BLOOD PRESSURE SUPPORT)
PREFILLED_SYRINGE | INTRAVENOUS | Status: AC
  Filled 2023-08-26: qty 10

## 2023-08-26 MED ORDER — LIDOCAINE 2% (20 MG/ML) 5 ML SYRINGE
INTRAMUSCULAR | Status: AC
  Filled 2023-08-26: qty 5

## 2023-08-26 MED ORDER — ONDANSETRON HCL 4 MG/2ML IJ SOLN
INTRAMUSCULAR | Status: DC | PRN
  Administered 2023-08-26: 4 mg via INTRAVENOUS

## 2023-08-26 MED ORDER — LIDOCAINE-EPINEPHRINE 1 %-1:100000 IJ SOLN
INTRAMUSCULAR | Status: DC | PRN
  Administered 2023-08-26: 12 mL

## 2023-08-26 MED ORDER — PHENAZOPYRIDINE HCL 100 MG PO TABS
ORAL_TABLET | ORAL | Status: AC
  Filled 2023-08-26: qty 2

## 2023-08-26 MED ORDER — ORAL CARE MOUTH RINSE: 15.0000 mL | Freq: Once | OROMUCOSAL | Status: AC

## 2023-08-26 MED ORDER — DEXMEDETOMIDINE HCL IN NACL 80 MCG/20ML IV SOLN
INTRAVENOUS | Status: DC | PRN
  Administered 2023-08-26 (×2): 8 ug via INTRAVENOUS

## 2023-08-26 MED ORDER — CEFAZOLIN SODIUM-DEXTROSE 2-4 GM/100ML-% IV SOLN
INTRAVENOUS | Status: AC
  Filled 2023-08-26: qty 100

## 2023-08-26 MED ORDER — BUPIVACAINE HCL (PF) 0.25 % IJ SOLN
INTRAMUSCULAR | Status: DC | PRN
  Administered 2023-08-26: 18 mL

## 2023-08-26 SURGICAL SUPPLY — 75 items
BLADE CLIPPER SENSICLIP SURGIC (BLADE) ×6 IMPLANT
BLADE SURG 15 STRL LF DISP TIS (BLADE) ×6 IMPLANT
CHLORAPREP W/TINT 26 (MISCELLANEOUS) ×6 IMPLANT
COVER BACK TABLE 60X90IN (DRAPES) ×6 IMPLANT
COVER TIP SHEARS 8 DVNC (MISCELLANEOUS) ×6 IMPLANT
DEFOGGER SCOPE WARMER CLEARIFY (MISCELLANEOUS) ×6 IMPLANT
DERMABOND ADVANCED .7 DNX12 (GAUZE/BANDAGES/DRESSINGS) ×8 IMPLANT
DRAPE ARM DVNC X/XI (DISPOSABLE) ×24 IMPLANT
DRAPE COLUMN DVNC XI (DISPOSABLE) ×6 IMPLANT
DRAPE SHEET LG 3/4 BI-LAMINATE (DRAPES) ×6 IMPLANT
DRAPE SURG IRRIG POUCH 19X23 (DRAPES) ×6 IMPLANT
DRAPE UTILITY XL STRL (DRAPES) ×6 IMPLANT
DRIVER NDL LRG 8 DVNC XI (INSTRUMENTS) IMPLANT
DRIVER NDL MEGA SUTCUT DVNCXI (INSTRUMENTS) IMPLANT
DRIVER NDLE LRG 8 DVNC XI (INSTRUMENTS) ×6 IMPLANT
DRIVER NDLE MEGA SUTCUT DVNCXI (INSTRUMENTS) ×6 IMPLANT
ELECT REM PT RETURN 9FT ADLT (ELECTROSURGICAL) ×6 IMPLANT
ELECTRODE REM PT RTRN 9FT ADLT (ELECTROSURGICAL) ×5 IMPLANT
FORCEPS BPLR 8 MD DVNC XI (FORCEP) ×1 IMPLANT
GAUZE 4X4 16PLY ~~LOC~~+RFID DBL (SPONGE) ×6 IMPLANT
GLOVE BIO SURGEON STRL SZ7 (GLOVE) ×1 IMPLANT
GLOVE BIOGEL PI IND STRL 6 (GLOVE) ×3 IMPLANT
GLOVE BIOGEL PI IND STRL 6.5 (GLOVE) ×6 IMPLANT
GLOVE BIOGEL PI IND STRL 7.0 (GLOVE) ×6 IMPLANT
GLOVE ECLIPSE 6.0 STRL STRAW (GLOVE) ×18 IMPLANT
GLOVE SURG UNDER POLY LF SZ6.5 (GLOVE) ×24 IMPLANT
GOWN STRL REUS W/ TWL LRG LVL3 (GOWN DISPOSABLE) ×6 IMPLANT
GOWN STRL REUS W/TWL LRG LVL3 (GOWN DISPOSABLE) ×6 IMPLANT
GOWN STRL SURGICAL XL XLNG (GOWN DISPOSABLE) ×1 IMPLANT
GRASPER TIP-UP FEN DVNC XI (INSTRUMENTS) ×1 IMPLANT
HIBICLENS CHG 4% 4OZ BTL (MISCELLANEOUS) ×6 IMPLANT
HOLDER FOLEY CATH W/STRAP (MISCELLANEOUS) ×6 IMPLANT
IRRIG SUCT STRYKERFLOW 2 WTIP (MISCELLANEOUS) ×6 IMPLANT
IRRIGATION SUCT STRKRFLW 2 WTP (MISCELLANEOUS) ×5 IMPLANT
IV NS 1000ML BAXH (IV SOLUTION) ×1 IMPLANT
KIT PINK PAD W/HEAD ARE REST (MISCELLANEOUS) ×6 IMPLANT
KIT PINK PAD W/HEAD ARM REST (MISCELLANEOUS) ×5 IMPLANT
KIT TURNOVER KIT B (KITS) ×6 IMPLANT
LEGGING LITHOTOMY PAIR STRL (DRAPES) ×6 IMPLANT
MANIFOLD NEPTUNE II (INSTRUMENTS) ×6 IMPLANT
MESH VERTESSA LITE -Y 2X4X3 (Mesh General) ×6 IMPLANT
NDL HYPO 22X1.5 SAFETY MO (MISCELLANEOUS) ×5 IMPLANT
NDL INSUFFLATION 14GA 120MM (NEEDLE) ×5 IMPLANT
NEEDLE HYPO 22X1.5 SAFETY MO (MISCELLANEOUS) ×6 IMPLANT
NEEDLE INSUFFLATION 14GA 120MM (NEEDLE) ×6 IMPLANT
OBTURATOR OPTICAL STND 8 DVNC (TROCAR) ×6 IMPLANT
OBTURATOR OPTICALSTD 8 DVNC (TROCAR) ×5 IMPLANT
PACK ROBOT WH (CUSTOM PROCEDURE TRAY) ×6 IMPLANT
PACK ROBOTIC GOWN (GOWN DISPOSABLE) ×6 IMPLANT
PAD OB MATERNITY 11 LF (PERSONAL CARE ITEMS) ×6 IMPLANT
PATTIES SURGICAL .5 X3 (DISPOSABLE) ×1 IMPLANT
PROTECTOR NERVE ULNAR (MISCELLANEOUS) ×6 IMPLANT
RETRACTOR LONE STAR DISPOSABLE (INSTRUMENTS) ×6 IMPLANT
RETRACTOR STAY HOOK 5MM (MISCELLANEOUS) ×6 IMPLANT
SCISSORS MNPLR CVD DVNC XI (INSTRUMENTS) ×1 IMPLANT
SCRUB CHG 4% DYNA-HEX 4OZ (MISCELLANEOUS) ×7 IMPLANT
SEAL UNIV 5-12 XI (MISCELLANEOUS) ×30 IMPLANT
SET IRRIG Y TYPE TUR BLADDER L (SET/KITS/TRAYS/PACK) ×6 IMPLANT
SET TUBE SMOKE EVAC HIGH FLOW (TUBING) ×6 IMPLANT
SLEEVE SCD COMPRESS KNEE MED (STOCKING) ×6 IMPLANT
SLING ADVANTAGE FIT TRANVAG (Sling) ×1 IMPLANT
SPIKE FLUID TRANSFER (MISCELLANEOUS) ×7 IMPLANT
SUCTION TUBE FRAZIER 10FR DISP (SUCTIONS) ×6 IMPLANT
SUT GORETEX NAB #0 THX26 36IN (SUTURE) ×12 IMPLANT
SUT MNCRL AB 4-0 PS2 18 (SUTURE) ×12 IMPLANT
SUT MON AB 2-0 SH 27 (SUTURE) ×6 IMPLANT
SUT V-LOC BARB 180 2/0GR9 GS23 (SUTURE) ×12 IMPLANT
SUT VIC AB 0 CT1 27XBRD ANTBC (SUTURE) ×6 IMPLANT
SUT VIC AB 2-0 SH 27XBRD (SUTURE) ×6 IMPLANT
SUT VICRYL 2-0 SH 8X27 (SUTURE) ×6 IMPLANT
SUTURE V-LC BRB 180 2/0GR9GS23 (SUTURE) ×10 IMPLANT
SYR BULB EAR ULCER 3OZ GRN STR (SYRINGE) ×6 IMPLANT
TOWEL GREEN STERILE FF (TOWEL DISPOSABLE) ×11 IMPLANT
TRAY FOL W/BAG SLVR 16FR STRL (SET/KITS/TRAYS/PACK) ×1 IMPLANT
WATER STERILE IRR 1000ML UROMA (IV SOLUTION) ×1 IMPLANT

## 2023-08-26 NOTE — Transfer of Care (Signed)
 Immediate Anesthesia Transfer of Care Note  Patient: Denise Rogers  Procedure(s) Performed: SACROCOLPOPEXY, ROBOT-ASSISTED, LAPAROSCOPIC (Pelvis) PERINEORRHAPHY (Perineum) CYSTOSCOPY (Urethra) midurethral sling (Vagina ) EXAM UNDER ANESTHESIA, PELVIC (Pelvis)  Patient Location: PACU  Anesthesia Type:General  Level of Consciousness: drowsy  Airway & Oxygen Therapy: Patient Spontanous Breathing and Patient connected to face mask oxygen  Post-op Assessment: Report given to RN and Post -op Vital signs reviewed and stable  Post vital signs: Reviewed and stable  Last Vitals:  Vitals Value Taken Time  BP 93/59 08/26/23 1030  Temp    Pulse 78 08/26/23 1027  Resp 14 08/26/23 1031  SpO2 100 % 08/26/23 1027  Vitals shown include unfiled device data.  Last Pain:  Vitals:   08/26/23 1026  TempSrc:   PainSc: Asleep      Patients Stated Pain Goal: 5 (08/26/23 0604)  Complications: No notable events documented.

## 2023-08-26 NOTE — Discharge Instructions (Addendum)
 DO NOT TAKE TYLENOL UNTIL AFTER 12:15p today.     Post Anesthesia Home Care Instructions  Activity: Get plenty of rest for the remainder of the day. A responsible adult should stay with you for 24 hours following the procedure.  For the next 24 hours, DO NOT: -Drive a car -Advertising copywriter -Drink alcoholic beverages -Take any medication unless instructed by your physician -Make any legal decisions or sign important papers.  Meals: Start with liquid foods such as gelatin or soup. Progress to regular foods as tolerated. Avoid greasy, spicy, heavy foods. If nausea and/or vomiting occur, drink only clear liquids until the nausea and/or vomiting subsides. Call your physician if vomiting continues.  Special Instructions/Symptoms: Your throat may feel dry or sore from the anesthesia or the breathing tube placed in your throat during surgery. If this causes discomfort, gargle with warm salt water. The discomfort should disappear within 24 hours.  If you had a scopolamine patch placed behind your ear for the management of post- operative nausea and/or vomiting:  1. The medication in the patch is effective for 72 hours, after which it should be removed.  Wrap patch in a tissue and discard in the trash. Wash hands thoroughly with soap and water. 2. You may remove the patch earlier than 72 hours if you experience unpleasant side effects which may include dry mouth, dizziness or visual disturbances. 3. Avoid touching the patch. Wash your hands with soap and water after contact with the patch.  POST OPERATIVE INSTRUCTIONS  General Instructions Recovery (not bed rest) will last approximately 6 weeks Walking is encouraged, but refrain from strenuous exercise/ housework/ heavy lifting. No lifting >10lbs  Nothing in the vagina- NO intercourse, tampons or douching Bathing:  Do not submerge in water (NO swimming, bath, hot tub, etc) until after your postop visit. You can shower starting the day after  surgery.  No driving until you are not taking narcotic pain medicine and until your pain is well enough controlled that you can slam on the breaks or make sudden movements if needed.   Taking your medications Please take your acetaminophen and ibuprofen on a schedule for the first 48 hours. Take 600mg  ibuprofen, then take 500mg  acetaminophen 3 hours later, then continue to alternate ibuprofen and acetaminophen. That way you are taking each type of medication every 6 hours. Take the prescribed narcotic (oxycodone, tramadol, etc) as needed, with a maximum being every 4 hours.  Take a stool softener daily to keep your stools soft and preventing you from straining. If you have diarrhea, you decrease your stool softener. This is explained more below. We have prescribed you Miralax.  Reasons to Call the Nurse (see last page for phone numbers) Heavy Bleeding (changing your pad every 1-2 hours) Persistent nausea/vomiting Fever (100.4 degrees or more) Incision problems (pus or other fluid coming out, redness, warmth, increased pain)  Things to Expect After Surgery Mild to Moderate pain is normal during the first day or two after surgery. If prescribed, take Ibuprofen or Tylenol first and use the stronger medicine for "break-through" pain. You can overlap these medicines because they work differently.   Constipation   To Prevent Constipation:  Eat a well-balanced diet including protein, grains, fresh fruit and vegetables.  Drink plenty of fluids. Walk regularly.  Depending on specific instructions from your physician: take Miralax daily and additionally you can add a stool softener (colace/ docusate) and fiber supplement. Continue as long as you're on pain medications.   To Treat Constipation:  If you do not have a bowel movement in 2 days after surgery, you can take 2 Tbs of Milk of Magnesia 1-2 times a day until you have a bowel movement. If diarrhea occurs, decrease the amount or stop the laxative. If  no results with Milk of Magnesia, you can drink a bottle of magnesium citrate which you can purchase over the counter.  Fatigue:  This is a normal response to surgery and will improve with time.  Plan frequent rest periods throughout the day.  Gas Pain:  This is very common but can also be very painful! Drink warm liquids such as herbal teas, bouillon or soup. Walking will help you pass more gas.  Mylicon or Gas-X can be taken over the counter.  Leaking Urine:  Varying amounts of leakage may occur after surgery.  This should improve with time. Your bladder needs at least 3 months to recover from surgery. If you leak after surgery, be sure to mention this to your doctor at your post-op visit. If you were taking medications for overactive bladder prior to surgery, be sure to restart the medications immediately after surgery.  Incisions: If you have incisions on your abdomen, the skin glue will dissolve on its own over time. It is ok to gently rinse with soap and water over these incisions but do not scrub.  Catheter Approximately 50% of patients are unable to urinate after surgery and need to go home with a catheter. This allows your bladder to rest so it can return to full function. If you go home with a catheter, the office will call to set up a voiding trial a few days after surgery. For most patients, by this visit, they are able to urinate on their own. Long term catheter use is rare.   Return to Work  As work demands and recovery times vary widely, it is hard to predict when you will want to return to work. If you have a desk job with no strenuous physical activity, and if you would like to return sooner than generally recommended, discuss this with your provider or call our office.   Post op concerns  For non-emergent issues, please call the Urogynecology Nurse. Please leave a message and someone will contact you within one business day.  You can also send a message through MyChart.   AFTER  HOURS (After 5:00 PM and on weekends):  For urgent matters that cannot wait until the next business day. Call our office [phone] and connect to the doctor on call.  Please reserve this for important issues.   **FOR ANY TRUE EMERGENCY ISSUES CALL 911 OR GO TO THE NEAREST EMERGENCY ROOM.** Please inform our office or the doctor on call of any emergency.     APPOINTMENTS: Call [phone]

## 2023-08-26 NOTE — Anesthesia Procedure Notes (Signed)
 Procedure Name: Intubation Date/Time: 08/26/2023 7:47 AM  Performed by: Thomasene Ripple, CRNAPre-anesthesia Checklist: Patient identified, Emergency Drugs available, Suction available and Patient being monitored Patient Re-evaluated:Patient Re-evaluated prior to induction Oxygen Delivery Method: Circle System Utilized Preoxygenation: Pre-oxygenation with 100% oxygen Induction Type: IV induction Ventilation: Mask ventilation without difficulty Laryngoscope Size: Miller and 3 Grade View: Grade I Tube type: Oral Tube size: 7.0 mm Number of attempts: 1 Airway Equipment and Method: Stylet and Oral airway Placement Confirmation: ETT inserted through vocal cords under direct vision, positive ETCO2 and breath sounds checked- equal and bilateral Secured at: 21 cm Tube secured with: Tape Dental Injury: Teeth and Oropharynx as per pre-operative assessment

## 2023-08-26 NOTE — Op Note (Signed)
 Operative Note  Preoperative Diagnosis: anterior vaginal prolapse, posterior vaginal prolapse, vaginal vault prolapse after hysterectomy, and stress urinary incontinence  Postoperative Diagnosis: same  Procedures performed:  Robotic assisted sacrocolpopexy Lorna Few Lite Y), cystoscopy, midurethral sling (Advantage Fit), perineorrhaphy  Implants:  Implant Name Type Inv. Item Serial No. Manufacturer Lot No. LRB No. Used Action  MESH Grayland Ormond [id number] Mesh General MESH Arlys John  [facility] [id number] N/A 1 Implanted  [place] FIT [facility] - [id number] Sling SLING ADVANTAGE FIT [employer] [id number] N/A 1 Implanted    Attending Surgeon: Lanetta Inch, MD   Assistant: Webb Silversmith, RNFA  Anesthesia: General endotracheal  Findings: 1. On vaginal exam, stage III prolapse present  2. On cystoscopy, normal bladder and urethral mucosa without injury or lesion. Brisk bilateral ureteral efflux present.    3. On laparoscopy, adhesions were noted at the following sites: bowel to the left pelvic sidewall. String like adhesion from the rectum to the vaginal vault.  Specimens: none  Estimated blood loss: 50 mL  IV fluids: see flowsheet  Urine output: 150 mL  Complications: none  Procedure in Detail:  After informed consent was obtained, the patient was taken to the operating room, where general anesthesia was induced and found to be adequate. She was placed in dorsolithotomy position in yellowfin stirrups. Her hips were noted not to be hyperflexed or hyperextended. Her arms were padded with gel pads and tucked to her sides. Her hands were surrounded by foam. A padded strap was placed across her chest with foam between the pad and her skin. She was noted to be appropriately positioned with all pressure points well padded and off tension. A tilt test showed no slippage. She was prepped and draped in the usual sterile fashion.   A sterile Foley catheter was inserted.   0.25% plain Marcaine was injected at the umbilicus and an incision was made with a scalpel. A Veress needle was inserted into the incision, CO2 insufflation was started, a low opening pressure was noted, and pneumoperitoneum was obtained. The Veress needle was removed and a 8mm robotic trocar was placed with direct visualization. Entry into the peritoneal cavity was confirmed. After determining placement for the other ports, Local anesthetic was injected at each site and two 8 mm incisions were made for robotic ports at 10 cm lateral to and at the level of the umbilical port. Two additional 8 mm incisions were made 10 cm lateral to these and 30 degrees down followed by 8 mm robotic ports - the right side for an assistant port. All trocars were placed sequentially under direct visualization of the camera. The patient was placed in Trendelenburg. The sacrum appeared to be free of any adhesive disease. The robot was docked on the patient's left side. Monopolar endoshears were placed in the right arm, a [place] bipolar grasper was placed in the 2nd arm of the patient's left side, and a Tip up grasper was placed in the 3rd arm on the patient's left side.      Attention was then turned to the sacral promontory. The peritoneum overlying the sacral promontory was tented up, dissected sharply with monopolar scissors and electrosurgery using layer by layer technique. The overlying areolar and adipose tissue were taken down until the anterior longitudinal sacral ligament was identified. Two transverse CV2 Gortex sutures were placed into the ligament. With a lucite probe in the vagina, anterior vaginal dissection was then performed with sharp dissection and electrosurgery  to separate the vesicovaginal space to the level of the urethra. The posterior vaginal dissection was then performed with sharp dissection and electrosurgery in order to dissect the rectum away from the posterior  vagina. The peritoneal incision was extended down to the posterior cul-de-sac. This was performed with care to avoid the ureter on the right side and the sigmoid colon and its mesentary on the left side.   Small vessels were cauterized along the way to obtain excellent hemostasis. A "Y" mesh was then inserted into the abdomen after trimming to appropriate size. With the probe in the vagina, the anterior leaf of the Y mesh was affixed to the anterior portion of the vagina using a 2-0 v-loc suture in a spiral pattern to distribute the suture evenly across the surface of the anterior mesh leaf. In a similar fashion, the posterior leaf of the Y mesh was attached to the posterior surface of the vagina with 2-0 v-loc suture.  The distal end of the mesh was then brought to overlie the sacrum. The correct amount of tension was determined in order to elevate the vagina, but not put the mesh under tension. The distal end of the mesh was then affixed to the anterior longitudinal sacral ligament using the previously placed sutures. The excess distal mesh was then cut and removed. The peritoneum was reapproximated over the mesh using 2-0 monocryl. The bladder flap was incorporated to completely retroperitonealize the mesh. All pedicles were carefully inspected and noted to be hemostatic as the CO2 gas was deflated. All instruments were removed from the patient's abdomen.    The Foley catheter was removed.  A 70-degree cystoscope was introduced, and 360-degree inspection revealed no injury, lesion or foreign body in the bladder. Brisk bilateral ureteral efflux was noted with the assistance of pyridium.  The bladder was drained and the cystoscope was removed.  The Foley catheter was replaced.   The robot was undocked. The CO2 gas was removed and the ports were removed.  The skin incisions were closed with subcutaneous stitches of 4-0 Monocryl and covered with skin glue.    The sling was performed next.  A lonestar  self-retraining retractor was placed with 4 stay hooks. The mid urethral area was located on the anterior vaginal wall.  Two Allis clamps were placed at the level of the midurethra. 1% lidocaine with epinephrine was injected into the vaginal mucosa. A vertical incision was made between the two clamps using a 15-blade scalpel.  Using sharp dissection, Metzenbaum scissors were used to make a periurethral tunnel from the vaginal incision towards the pubic rami bilaterally for the future sling tracts. The bladder was ensured to be empty. The trocar and attached sling were introduced into the right side of the periurethral vaginal incision, just inferior to the pubic symphysis on the right side. The trocar was guided through the endopelvic fascia and directly vertically.  While hugging the cephalad surface of the pubic bone, the trocar was guided out through the abdomen 2 fingerbreadths lateral to midline at the level of the pubic symphysis on the ipsilateral side. The trocar was placed on the left side in a similar fashion.  A 70-degree cystoscope was introduced, and 360-degree inspection revealed no trauma or trocars in the bladder, with brisk bilateral ureteral efflux.  The bladder was drained and the cystoscope was removed.  The Foley catheter was reinserted.  The sling was brought to lie beneath the mid-urethra.  A needle driver was placed behind the sling to ensure  no tension.   The plastic sheath was removed from the sling and the distal ends of the sling were trimmed just below the level of the skin incisions.  Tension-free positioning of the sling was confirmed. Vaginal inspection revealed no vaginotomy or sling perforations of the mucosa.  The vaginal mucosal edges were reapproximated using 2-0 Vicryl.  The vagina was copiously irrigated.  Hemostasis was again noted.   A small perineorrhaphy was needed. Two allis clamps were placed at the introitus. The perineum was injected with 1% lidocaine with  epinephrine. A diamond shaped incision was made over the perineum and excess skin was removed. Dissection was performed with Metzenbaum scissors to separate the mucosa from the underlying tissue. The perineal body was then reapproximated with an interrupted 0-vicryl sutures. The perineal skin was then closed with a 2-0 vicryl in a subcutaneous and running fashion. Irrigation was performed and good hemostasis was noted. The patient tolerated the procedure well. She was awakened and taken to the recovery room in stable condition. Needle and sponge counts were correct x2.   Marguerita Beards, MD

## 2023-08-26 NOTE — Interval H&P Note (Signed)
 History and Physical Interval Note:  08/26/2023 7:13 AM  Denise Rogers  has presented today for surgery, with the diagnosis of prolpapse of vaginal vault after hysterectomy, stress urinary incontinence.  The various methods of treatment have been discussed with the patient and family. After consideration of risks, benefits and other options for treatment, the patient has consented to  Procedure(s) with comments: SACROCOLPOPEXY, ROBOT-ASSISTED, LAPAROSCOPIC (N/A)  PERINEORRHAPHY (N/A) POSTERIOR REPAIR (RECTOCELE)- POSSIBLE (N/A) CYSTOSCOPY (N/A) midurethral sling (N/A) as a surgical intervention.  The patient's history has been reviewed, patient examined, no change in status, stable for surgery.  I have reviewed the patient's chart and labs.  Questions were answered to the patient's satisfaction.     Marguerita Beards

## 2023-08-26 NOTE — Anesthesia Postprocedure Evaluation (Signed)
 Anesthesia Post Note  Patient: Denise Rogers  Procedure(s) Performed: SACROCOLPOPEXY, ROBOT-ASSISTED, LAPAROSCOPIC (Pelvis) PERINEORRHAPHY (Perineum) CYSTOSCOPY (Urethra) midurethral sling (Vagina ) EXAM UNDER ANESTHESIA, PELVIC (Pelvis)     Patient location during evaluation: PACU Anesthesia Type: General Level of consciousness: awake and alert Pain management: pain level controlled Vital Signs Assessment: post-procedure vital signs reviewed and stable Respiratory status: spontaneous breathing, nonlabored ventilation and respiratory function stable Cardiovascular status: stable and blood pressure returned to baseline Anesthetic complications: no   No notable events documented.  Last Vitals:  Vitals:   08/26/23 1100 08/26/23 1130  BP: 110/65 108/73  Pulse: 80 75  Resp: 14 15  Temp:  (!) 35.8 C  SpO2: 98% 95%    Last Pain:  Vitals:   08/26/23 1026  TempSrc:   PainSc: Asleep                 Beryle Lathe

## 2023-08-27 ENCOUNTER — Encounter (HOSPITAL_COMMUNITY): Payer: Self-pay | Admitting: Obstetrics and Gynecology

## 2023-08-27 ENCOUNTER — Telehealth: Payer: Self-pay | Admitting: Obstetrics and Gynecology

## 2023-08-27 NOTE — Telephone Encounter (Signed)
 Denise Rogers underwent Robotic assisted sacrocolpopexy Denise Rogers Y), cystoscopy, midurethral sling (Advantage Fit), perineorrhaphy on 08/26/23.   She passed her voiding trial.  was backfilled into the bladder Voided  PVR by bladder scan was .   She was discharged without a catheter. Please call her for a routine post op check. Thanks!  Marguerita Beards, MD

## 2023-08-28 NOTE — Telephone Encounter (Addendum)
 Denise Rogers underwent a sacrocolpopexy Denise Rogers Lite Y), cystoscopy, midurethral sling (Advantage Fit), perineorrhaphy on 08-27-2023 with Dr.Schroeder The patient reports that her pain is controlled. She is taking Tylenol and ibuprofen medication. She has not used her oxycodone. She has had some spotting, but no heavy vaginal bleeding.  The patient is tolerating PO fluids and solids. She has not had a bowel movement and is taking Miralax for a bowel regimen. She is passing gas.  She does not having any additional questions. She will keep her scheduled post op visit and will call the office if she has any concerns.

## 2023-08-28 NOTE — Telephone Encounter (Signed)
 LVM for patient to return call for post-op check

## 2023-08-30 ENCOUNTER — Other Ambulatory Visit (HOSPITAL_BASED_OUTPATIENT_CLINIC_OR_DEPARTMENT_OTHER): Payer: Self-pay

## 2023-09-28 ENCOUNTER — Other Ambulatory Visit (HOSPITAL_BASED_OUTPATIENT_CLINIC_OR_DEPARTMENT_OTHER): Payer: Self-pay

## 2023-10-09 ENCOUNTER — Encounter: Payer: Self-pay | Admitting: Obstetrics and Gynecology

## 2023-10-09 ENCOUNTER — Ambulatory Visit (INDEPENDENT_AMBULATORY_CARE_PROVIDER_SITE_OTHER): Payer: 59 | Admitting: Obstetrics and Gynecology

## 2023-10-09 VITALS — BP 122/89 | HR 78

## 2023-10-09 DIAGNOSIS — Z9889 Other specified postprocedural states: Secondary | ICD-10-CM

## 2023-10-09 DIAGNOSIS — Z48816 Encounter for surgical aftercare following surgery on the genitourinary system: Secondary | ICD-10-CM

## 2023-10-09 NOTE — Progress Notes (Signed)
 [facility]  Date of Visit: 10/09/2023  History of Present Illness: Denise Rogers is a 58 y.o. female scheduled today for a post-operative visit.   Surgery: s/p Robotic assisted sacrocolpopexy [name], cystoscopy, midurethral sling (Advantage Fit), perineorrhaphy on 08/26/23  She passed her postoperative void trial.   Postoperative course has been uncomplicated.   Today she reports she is doing well. Did not need any pain medication after the surgery. Feels she recovered well.   UTI in the last 6 weeks? No  Pain? No  She has returned to her normal activity (except for postop restrictions) Vaginal bulge? No  Stress incontinence: No  Urgency/frequency: No  Urge incontinence: No  Voiding dysfunction: No  Bowel issues: No - has been using the miralax   Subjective Success: Do you usually have a bulge or something falling out that you can see or feel in the vaginal area? No  Retreatment Success: Any retreatment with surgery or pessary for any compartment? No    Medications: She has a current medication list which includes the following prescription(s): aspirin, estradiol , multiple vitamin, olmesartan -hydrochlorothiazide , omega-3 fatty acids, polyethylene glycol powder, rosuvastatin , and ozempic  (2 mg/dose).   Allergies: Patient is allergic to lisinopril .   Physical Exam: BP 122/89   Pulse 78   LMP 04/19/2011   Abdomen: soft, non-tender, without masses or organomegaly Laparoscopic and suprapubic Incisions: healing well.  Pelvic Examination: Incisions well healed, no suture present. No tenderness along the anterior or posterior vagina. No apical tenderness. No pelvic masses. No visible or palpable mesh.  POP-Q: POP-Q  -3                                            Aa   -3                                           Ba  -8                                              C   2                                            Gh  4                                             Pb  8                                            tvl   -3                                            Ap  -3  Bp                                                 D    ---------------------------------------------------------  Assessment and Plan:  1. Post-operative state    - Good result with surgery, well healed. - Can resume regular activity including exercise and intercourse,  if desired.  - Discussed avoidance of heavy lifting and straining long term to reduce the risk of recurrence.   Return as needed  Arma Lamp, MD

## 2023-11-19 ENCOUNTER — Encounter: Payer: Self-pay | Admitting: *Deleted

## 2023-11-19 NOTE — Progress Notes (Signed)
 Opened in error. Denise Rogers

## 2023-11-21 ENCOUNTER — Other Ambulatory Visit (HOSPITAL_BASED_OUTPATIENT_CLINIC_OR_DEPARTMENT_OTHER): Payer: Self-pay

## 2023-11-21 MED ORDER — AMOXICILLIN-POT CLAVULANATE 875-125 MG PO TABS
1.0000 | ORAL_TABLET | Freq: Two times a day (BID) | ORAL | 0 refills | Status: AC
  Filled 2023-11-21: qty 20, 10d supply, fill #0

## 2023-12-19 ENCOUNTER — Other Ambulatory Visit (HOSPITAL_BASED_OUTPATIENT_CLINIC_OR_DEPARTMENT_OTHER): Payer: Self-pay

## 2023-12-26 ENCOUNTER — Other Ambulatory Visit (HOSPITAL_BASED_OUTPATIENT_CLINIC_OR_DEPARTMENT_OTHER): Payer: Self-pay

## 2023-12-26 MED ORDER — NAPROXEN 500 MG PO TABS
ORAL_TABLET | ORAL | 0 refills | Status: AC
  Filled 2023-12-26: qty 28, 14d supply, fill #0

## 2024-01-07 ENCOUNTER — Other Ambulatory Visit (HOSPITAL_BASED_OUTPATIENT_CLINIC_OR_DEPARTMENT_OTHER): Payer: Self-pay

## 2024-01-07 MED ORDER — PREDNISONE 10 MG PO TABS
ORAL_TABLET | ORAL | 0 refills | Status: AC
  Filled 2024-01-07: qty 21, 6d supply, fill #0

## 2024-01-13 ENCOUNTER — Other Ambulatory Visit: Payer: Self-pay

## 2024-02-26 ENCOUNTER — Other Ambulatory Visit (HOSPITAL_BASED_OUTPATIENT_CLINIC_OR_DEPARTMENT_OTHER): Payer: Self-pay

## 2024-02-28 ENCOUNTER — Other Ambulatory Visit (HOSPITAL_BASED_OUTPATIENT_CLINIC_OR_DEPARTMENT_OTHER): Payer: Self-pay

## 2024-02-28 MED ORDER — OZEMPIC (2 MG/DOSE) 8 MG/3ML ~~LOC~~ SOPN
2.0000 mg | PEN_INJECTOR | SUBCUTANEOUS | 11 refills | Status: AC
  Filled 2024-02-28: qty 3, 28d supply, fill #0
  Filled 2024-03-22: qty 3, 28d supply, fill #1
  Filled 2024-04-19: qty 3, 28d supply, fill #2
  Filled 2024-05-17: qty 3, 28d supply, fill #3
  Filled 2024-06-12: qty 3, 28d supply, fill #4

## 2024-03-03 ENCOUNTER — Other Ambulatory Visit: Payer: Self-pay | Admitting: Family Medicine

## 2024-03-03 DIAGNOSIS — Z0000 Encounter for general adult medical examination without abnormal findings: Secondary | ICD-10-CM

## 2024-03-24 ENCOUNTER — Other Ambulatory Visit (HOSPITAL_BASED_OUTPATIENT_CLINIC_OR_DEPARTMENT_OTHER): Payer: Self-pay

## 2024-03-31 ENCOUNTER — Ambulatory Visit
Admission: RE | Admit: 2024-03-31 | Discharge: 2024-03-31 | Disposition: A | Discharge status: Home / Self Care | Source: Ambulatory Visit | Attending: Family Medicine | Admitting: Family Medicine

## 2024-03-31 DIAGNOSIS — Z0000 Encounter for general adult medical examination without abnormal findings: Secondary | ICD-10-CM

## 2024-04-19 ENCOUNTER — Other Ambulatory Visit (HOSPITAL_BASED_OUTPATIENT_CLINIC_OR_DEPARTMENT_OTHER): Payer: Self-pay

## 2024-04-20 ENCOUNTER — Other Ambulatory Visit (HOSPITAL_BASED_OUTPATIENT_CLINIC_OR_DEPARTMENT_OTHER): Payer: Self-pay

## 2024-04-20 ENCOUNTER — Other Ambulatory Visit: Payer: Self-pay

## 2024-04-20 MED ORDER — ROSUVASTATIN CALCIUM 5 MG PO TABS
5.0000 mg | ORAL_TABLET | ORAL | 3 refills | Status: AC
  Filled 2024-04-20: qty 8, 28d supply, fill #0
  Filled 2024-05-17: qty 8, 28d supply, fill #1
  Filled 2024-06-12: qty 8, 28d supply, fill #2

## 2024-04-20 MED ORDER — OLMESARTAN MEDOXOMIL-HCTZ 20-12.5 MG PO TABS
1.0000 | ORAL_TABLET | Freq: Every day | ORAL | 3 refills | Status: AC
  Filled 2024-04-20: qty 30, 30d supply, fill #0
  Filled 2024-05-17: qty 30, 30d supply, fill #1
  Filled 2024-06-12: qty 30, 30d supply, fill #2

## 2024-05-12 ENCOUNTER — Other Ambulatory Visit (HOSPITAL_BASED_OUTPATIENT_CLINIC_OR_DEPARTMENT_OTHER): Payer: Self-pay

## 2024-06-15 ENCOUNTER — Other Ambulatory Visit (HOSPITAL_BASED_OUTPATIENT_CLINIC_OR_DEPARTMENT_OTHER): Payer: Self-pay
